# Patient Record
Sex: Female | Born: 1940 | Race: White | Hispanic: No | Smoking: Never smoker
Health system: Southern US, Community
[De-identification: ages and names within clinical notes are randomized; demographics above are authoritative.]

## PROBLEM LIST (undated history)

## (undated) DIAGNOSIS — K589 Irritable bowel syndrome without diarrhea: Secondary | ICD-10-CM

## (undated) DIAGNOSIS — Z8709 Personal history of other diseases of the respiratory system: Secondary | ICD-10-CM

## (undated) DIAGNOSIS — M1990 Unspecified osteoarthritis, unspecified site: Secondary | ICD-10-CM

## (undated) DIAGNOSIS — J189 Pneumonia, unspecified organism: Secondary | ICD-10-CM

## (undated) DIAGNOSIS — K449 Diaphragmatic hernia without obstruction or gangrene: Secondary | ICD-10-CM

## (undated) DIAGNOSIS — K219 Gastro-esophageal reflux disease without esophagitis: Secondary | ICD-10-CM

## (undated) DIAGNOSIS — Z8669 Personal history of other diseases of the nervous system and sense organs: Secondary | ICD-10-CM

## (undated) DIAGNOSIS — R112 Nausea with vomiting, unspecified: Secondary | ICD-10-CM

## (undated) DIAGNOSIS — K648 Other hemorrhoids: Secondary | ICD-10-CM

## (undated) DIAGNOSIS — T8859XA Other complications of anesthesia, initial encounter: Secondary | ICD-10-CM

## (undated) DIAGNOSIS — H409 Unspecified glaucoma: Secondary | ICD-10-CM

## (undated) DIAGNOSIS — Z9889 Other specified postprocedural states: Secondary | ICD-10-CM

## (undated) DIAGNOSIS — M549 Dorsalgia, unspecified: Secondary | ICD-10-CM

## (undated) DIAGNOSIS — G8929 Other chronic pain: Secondary | ICD-10-CM

## (undated) DIAGNOSIS — T4145XA Adverse effect of unspecified anesthetic, initial encounter: Secondary | ICD-10-CM

## (undated) HISTORY — PX: LEFT OOPHORECTOMY: SHX1961

## (undated) HISTORY — DX: Unspecified glaucoma: H40.9

## (undated) HISTORY — DX: Gastro-esophageal reflux disease without esophagitis: K21.9

## (undated) HISTORY — DX: Irritable bowel syndrome, unspecified: K58.9

## (undated) HISTORY — DX: Other hemorrhoids: K64.8

## (undated) HISTORY — PX: OTHER SURGICAL HISTORY: SHX169

## (undated) HISTORY — DX: Diaphragmatic hernia without obstruction or gangrene: K44.9

## (undated) HISTORY — PX: RIGHT OOPHORECTOMY: SHX2359

## (undated) HISTORY — PX: APPENDECTOMY: SHX54

## (undated) HISTORY — PX: TONSILLECTOMY: SUR1361

---

## 1979-07-13 HISTORY — PX: ABDOMINAL HYSTERECTOMY: SHX81

## 1984-07-12 DIAGNOSIS — J189 Pneumonia, unspecified organism: Secondary | ICD-10-CM

## 1984-07-12 HISTORY — DX: Pneumonia, unspecified organism: J18.9

## 1994-07-12 DIAGNOSIS — Z8709 Personal history of other diseases of the respiratory system: Secondary | ICD-10-CM

## 1994-07-12 HISTORY — DX: Personal history of other diseases of the respiratory system: Z87.09

## 1999-02-17 ENCOUNTER — Other Ambulatory Visit: Admission: RE | Admit: 1999-02-17 | Discharge: 1999-02-17 | Payer: Self-pay | Admitting: Obstetrics and Gynecology

## 2000-02-29 ENCOUNTER — Other Ambulatory Visit: Admission: RE | Admit: 2000-02-29 | Discharge: 2000-02-29 | Payer: Self-pay | Admitting: Obstetrics and Gynecology

## 2001-03-27 ENCOUNTER — Other Ambulatory Visit: Admission: RE | Admit: 2001-03-27 | Discharge: 2001-03-27 | Payer: Self-pay | Admitting: Obstetrics and Gynecology

## 2004-02-12 ENCOUNTER — Ambulatory Visit (HOSPITAL_COMMUNITY): Admission: RE | Admit: 2004-02-12 | Discharge: 2004-02-12 | Payer: Self-pay | Admitting: Internal Medicine

## 2004-03-06 ENCOUNTER — Ambulatory Visit (HOSPITAL_COMMUNITY): Admission: RE | Admit: 2004-03-06 | Discharge: 2004-03-06 | Payer: Self-pay | Admitting: Internal Medicine

## 2004-03-06 HISTORY — PX: COLONOSCOPY: SHX174

## 2004-03-17 ENCOUNTER — Ambulatory Visit (HOSPITAL_COMMUNITY): Admission: RE | Admit: 2004-03-17 | Discharge: 2004-03-17 | Payer: Self-pay | Admitting: Internal Medicine

## 2004-08-04 ENCOUNTER — Ambulatory Visit (HOSPITAL_COMMUNITY): Admission: RE | Admit: 2004-08-04 | Discharge: 2004-08-04 | Payer: Self-pay | Admitting: Internal Medicine

## 2004-12-17 ENCOUNTER — Ambulatory Visit: Payer: Self-pay | Admitting: Internal Medicine

## 2004-12-21 ENCOUNTER — Ambulatory Visit (HOSPITAL_COMMUNITY): Admission: RE | Admit: 2004-12-21 | Discharge: 2004-12-21 | Payer: Self-pay | Admitting: Internal Medicine

## 2005-01-08 ENCOUNTER — Ambulatory Visit: Payer: Self-pay | Admitting: Internal Medicine

## 2005-01-08 ENCOUNTER — Ambulatory Visit (HOSPITAL_COMMUNITY): Admission: RE | Admit: 2005-01-08 | Discharge: 2005-01-08 | Payer: Self-pay | Admitting: Internal Medicine

## 2005-01-08 HISTORY — PX: ESOPHAGOGASTRODUODENOSCOPY: SHX1529

## 2005-02-09 ENCOUNTER — Ambulatory Visit: Payer: Self-pay | Admitting: Internal Medicine

## 2005-08-28 IMAGING — CT CT ABDOMEN WO/W CM
3 of 6 series · 13 of 32 positions shown, 18 images · IV contrast (CONTRAST)
Comparison: none

CLINICAL DATA: Small low density area seen posterior inferior aspect right lobe of the liver on scan of 02/12/04.
 CT ABDOMEN WITHOUT/WITH CONTRAST:
 Prior to the intravenous injection of contrast material, a series of scans through the liver were made and show no definite abnormality in the region of the previously described 6 mm low density area of the right posterior lobe of the inferior liver. No other lesions are seen within the liver.  There is considerable food material and fluid within the stomach.  There is no abnormal calcification within the gallbladder or kidneys.
 The patient was then given 150 ml of Omnipaque 300 intravenously.  A series of scans were made using the protocol for hemangioma and there was seen a 6 mm hemangioma associated with the posterior aspect of the inferior region right lobe of the liver. No other lesions are seen within the liver. Both kidneys excrete contrast symmetrically.  The adrenal glands are normal. The gallbladder, hepatic radicals, common bile duct, pancreas and spleen appear normal. Lower lung feels normal.

[Series 7570: — · axial · 0.60mm/px · z∈[+1437,+1572]mm · 4 of 45 slices shown (1 of 3)]
[im 9/45  soft-tissue]
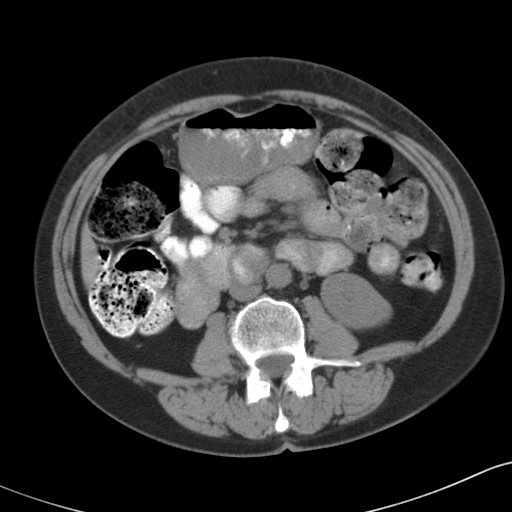
[im 18/45  soft-tissue]
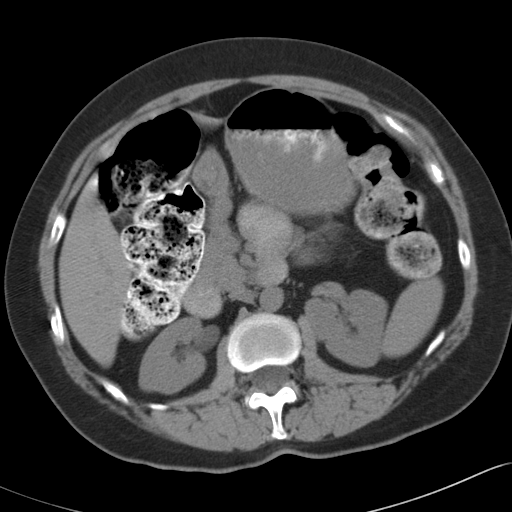
[im 27/45  soft-tissue]
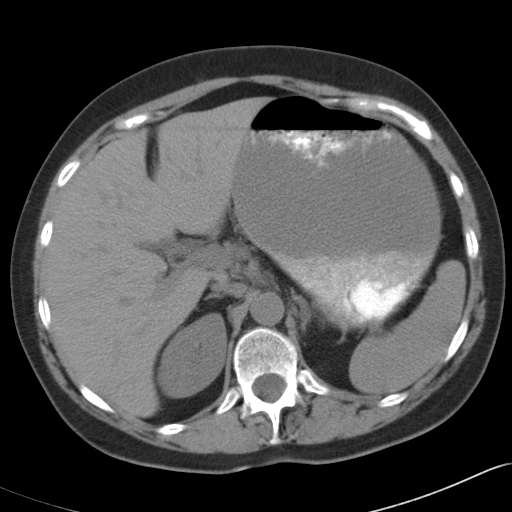
[im 36/45  soft-tissue]
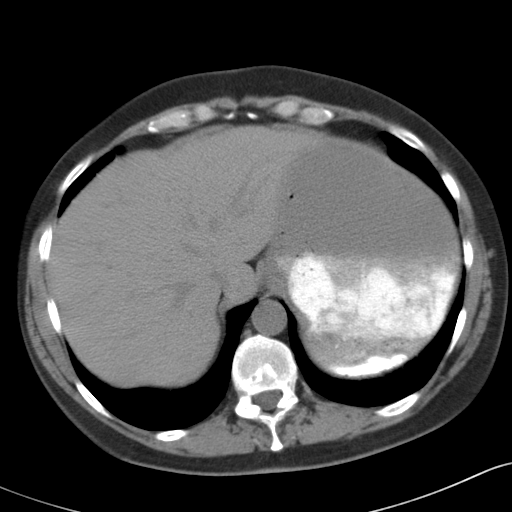

[Series 7576: — · axial · 0.61mm/px · z∈[+1414,+1594]mm · 5 of 56 slices shown, 10 images (2 of 3)]
[im 10/56  soft-tissue]
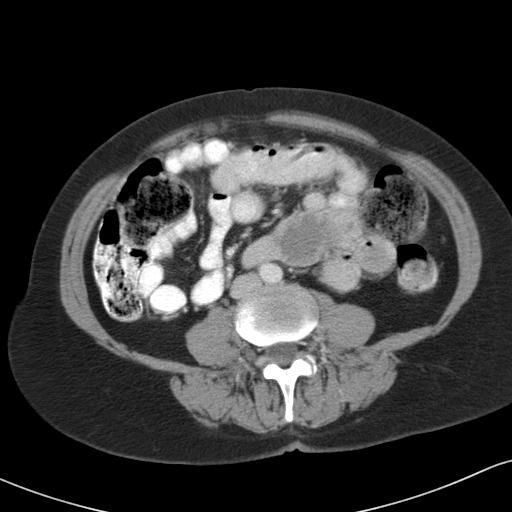
[im 10/56  bone]
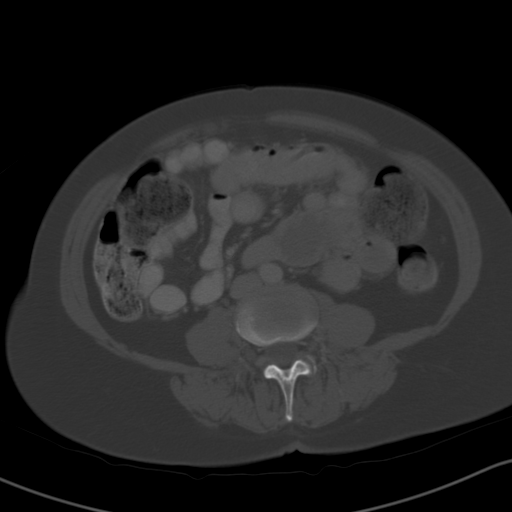
[im 19/56  soft-tissue]
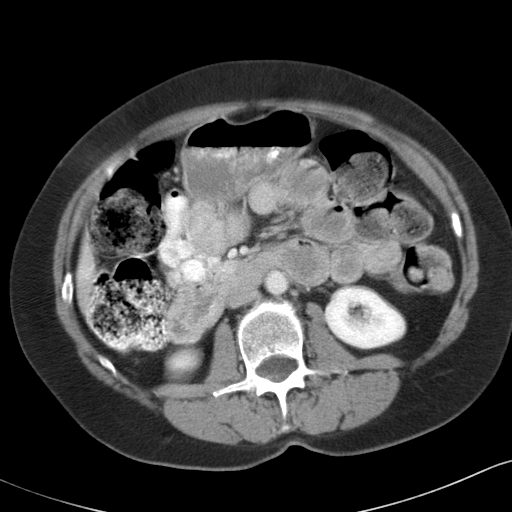
[im 19/56  lung]
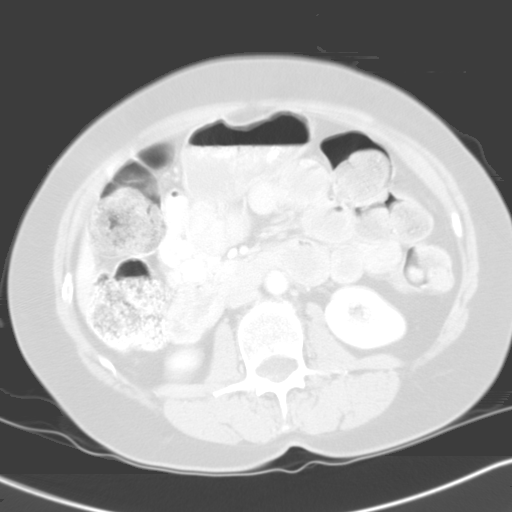
[im 28/56  soft-tissue]
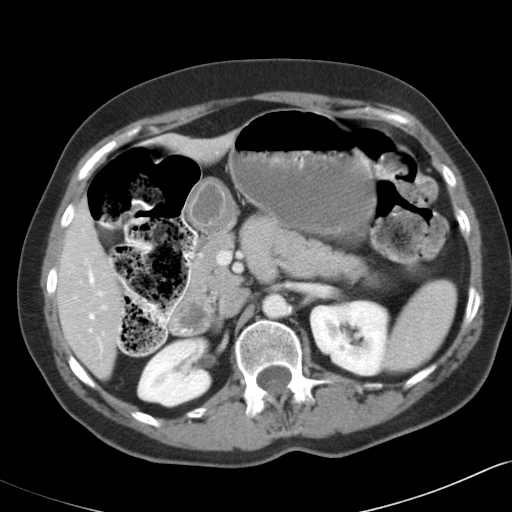
[im 28/56  lung]
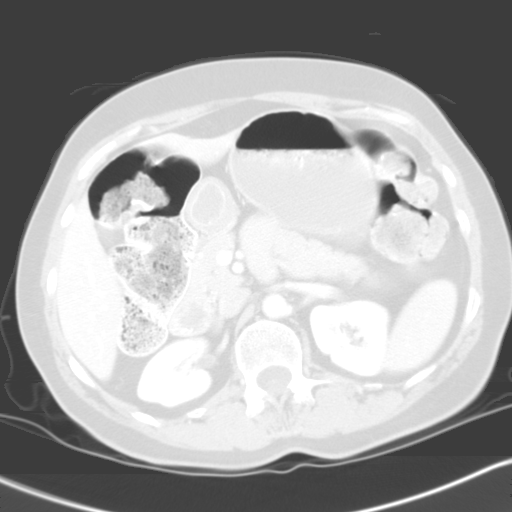
[im 37/56  soft-tissue]
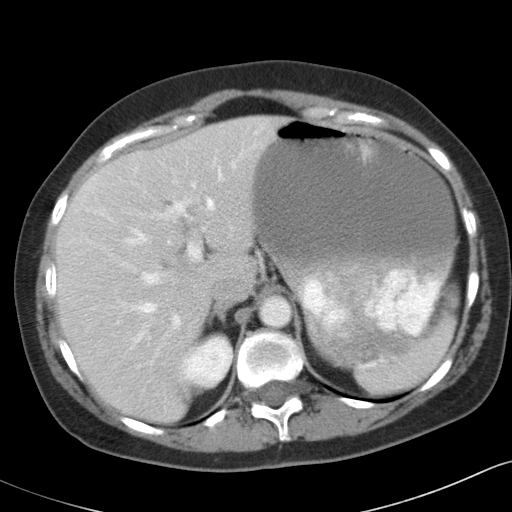
[im 37/56  lung]
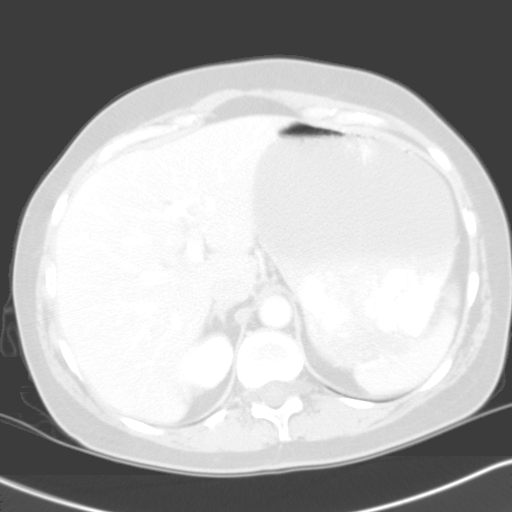
[im 46/56  soft-tissue]
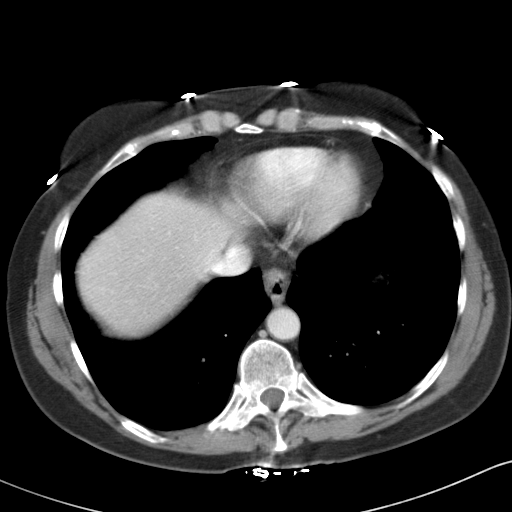
[im 46/56  lung]
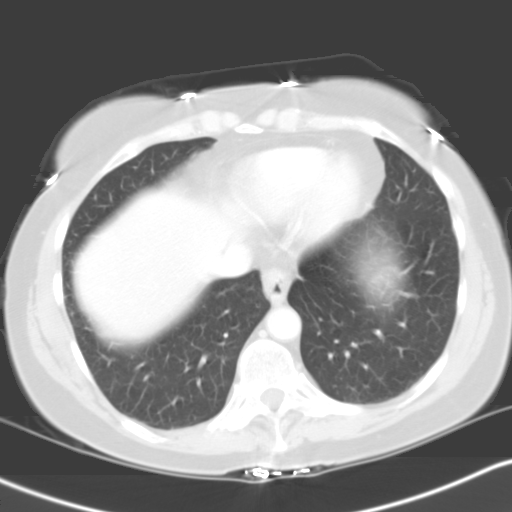

[Series 7578: — · axial · 0.60mm/px · z∈[+1430,+1570]mm · 4 of 48 slices shown (3 of 3)]
[im 10/48  soft-tissue]
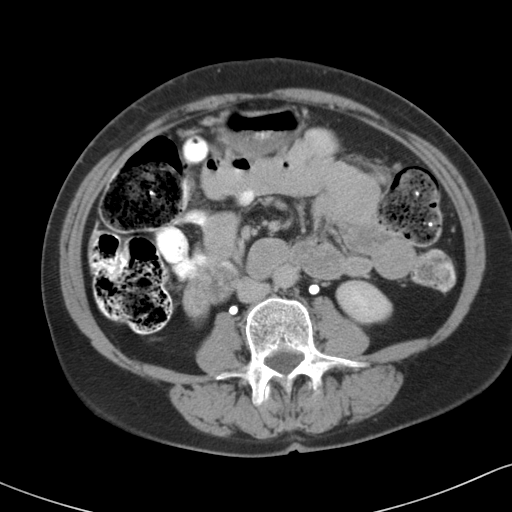
[im 19/48  soft-tissue]
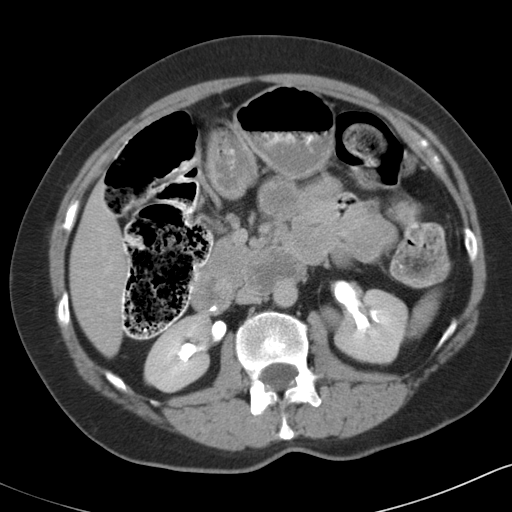
[im 29/48  soft-tissue]
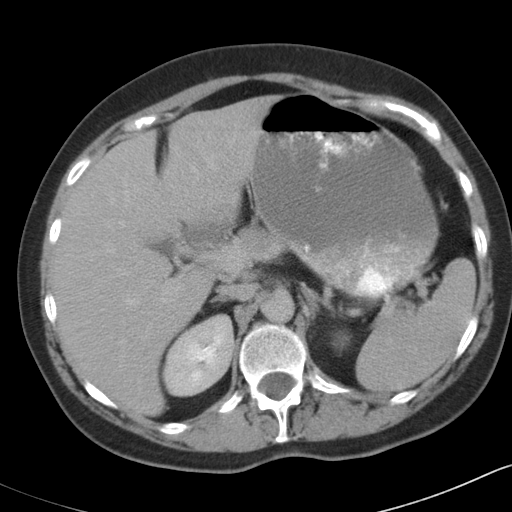
[im 38/48  soft-tissue]
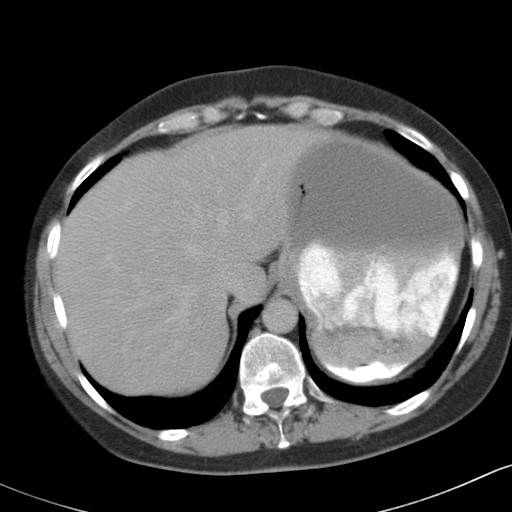

[13 of 32 positions shown; findings below may reference images not displayed]

IMPRESSION: 6 mm hemangioma posterior inferior aspect right lobe of the liver, unchanged from previous study.

## 2005-11-01 ENCOUNTER — Ambulatory Visit: Payer: Self-pay | Admitting: Internal Medicine

## 2006-12-09 ENCOUNTER — Ambulatory Visit: Payer: Self-pay | Admitting: Internal Medicine

## 2007-11-21 ENCOUNTER — Ambulatory Visit: Payer: Self-pay | Admitting: Internal Medicine

## 2009-11-20 ENCOUNTER — Encounter (INDEPENDENT_AMBULATORY_CARE_PROVIDER_SITE_OTHER): Payer: Self-pay | Admitting: *Deleted

## 2009-12-05 ENCOUNTER — Ambulatory Visit: Payer: Self-pay | Admitting: Gastroenterology

## 2009-12-05 DIAGNOSIS — K219 Gastro-esophageal reflux disease without esophagitis: Secondary | ICD-10-CM | POA: Insufficient documentation

## 2009-12-05 DIAGNOSIS — K589 Irritable bowel syndrome without diarrhea: Secondary | ICD-10-CM

## 2010-08-11 NOTE — Assessment & Plan Note (Signed)
Summary: FU OV 2 YR WITH RMR,CHRONIC GERD,IBS/CM   Visit Type:  Follow-up Visit Primary Care Provider:  Vyas  Chief Complaint:  F/U Chronic gerd/IBS.  History of Present Illness: Mikayla [name] is here for f/u GERD, IBS. She is doing well on Zegeric OTC once daily. Denies heartburn unless she stops medication for more than 2 days. Denies dysphagia, abd pain, constipation, diarrhea, melena, brbpr. She has occasional abd cramps when she gets nervous. She takes Bentyl as needed, but rarely uses it. She states her opthalmologist approved its use, she has open-angled glaucoma.  Current Medications (verified): 1)  Zegerid Otc 20-1100 Mg Caps (Omeprazole-Sodium Bicarbonate) .... One By Mouth Daily 2)  Multi-Vitamins 3)  Asa 325 .... As Needed For Back Pain 4)  Mg/calcium/zinc/vit D .... One By Mouth Bid 5)  Caltrate 600+d 600-400 Mg-Unit Tabs (Calcium Carbonate-Vitamin D) .... One Bid 6)  Bayer Womens 81-300 Mg Tabs (Aspirin-Calcium Carbonate) .... One By Mouth Daily 7)  Bentyl 10 Mg Caps (Dicyclomine Hcl) .... One By Mouth Daily Prn  Allergies (verified): 1)  ! Penicillin 2)  ! * All Mycins 3)  ! Niacin  Past History:  Past Medical History: GERD Glaucoma Irritable Bowel Syndrome EGD, 6/06-->multiple distal esophageal erosions, small hiatal hernia TCS, 8/05-->internal hemorrhoids  Past Surgical History: Right oophorectomy  Partial left oophorectomy Appendectomy  Family History: Mother, RA Father, bone cancer Paternal uncle, pancreatic cancer No FH CRC  Social History: Widowed. No children. Retired from [employer]. Never smoked. No alcohol.  Review of Systems      See HPI  Vital Signs:  Patient profile:   70 year old female Height:      64 inches Weight:      177 pounds BMI:     30.49 Temp:     98.4 degrees F oral Pulse rate:   84 / minute BP sitting:   120 / 78  (left arm) Cuff size:   regular  Vitals Entered By: Mikayla Spring LPN (Dec 05, 2009 10:25 AM)  Physical  Exam  General:  Well developed, well nourished, no acute distress. Head:  Normocephalic and atraumatic. Eyes:  Sclera nonicteric. Mouth:  OP moist. Abdomen:  Bowel sounds normal.  Abdomen is soft, nontender, nondistended.  No rebound or guarding.  No hepatosplenomegaly, masses or hernias.  No abdominal bruits.  Extremities:  No clubbing, cyanosis, edema or deformities noted. Neurologic:  Alert and  oriented x4;  grossly normal neurologically. Skin:  Intact without significant lesions or rashes. Psych:  Alert and cooperative. Normal mood and affect.  Impression & Recommendations:  Problem # 1:  GERD (ICD-530.81)  Well-controlled on Zegerid OTC daily. OV in 2 years with Dr. Jena Gauss.  Orders: Est. Patient Level II (60454)  Problem # 2:  IRRITABLE BOWEL SYNDROME (ICD-564.1)  Doing well. Uses bentyl rarely.  Orders: Est. Patient Level II (09811)  Appended Document: FU OV 2 YR WITH RMR,CHRONIC GERD,IBS/CM reminder in computer

## 2010-08-11 NOTE — Letter (Signed)
Summary: Recall Office Visit  [facility]  [address]   [place], [place] [address]   Phone: [phone]  Fax: [phone]      Nov 20, 2009   [place] [address] [place], [place]  [address] 01/23/1941   Dear Ms. Stepter,   According to our records, it is time for you to schedule a follow-up office visit with [place].   At your convenience, please call [phone] to schedule an office visit. If you have any questions, concerns, or feel that this letter is in error, we would appreciate your call.   Sincerely,    Diana Eves  [facility] Ph: [phone]   Fax: [phone]

## 2010-11-24 NOTE — Assessment & Plan Note (Signed)
NAME:  Mikayla, [name]                  CHART#:  [id number]   DATE:  11/21/2007                       DOB:  1941/02/15   CHIEF COMPLAINT:  Followup GERD and IBS.   PROBLEM LIST:  1. 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  2. History of glaucoma.  3. Erosive reflux esophagitis/chronic GERD, on daily PPI.  4. Negative colonoscopy August 2005,  Dr. Jena Gauss.  5. EGD June 2006, reflux esophagitis.   SUBJECTIVE:  Mikayla [name] is a 70 year old Caucasian female.  She has been  doing quite well since she was last seen about 1 year ago.  She has not  really had to use Levbid or Bentyl as far as for her diarrhea  predominant IBS.  She is having a bowel movement about every day or so.  She does have history of glaucoma and has recently been seeing Dr.  Harlon Flor through [facility].  He also has an office in [place].  She  does take Metamucil every day.  She is using Zegerid 40 mg daily for her  chronic GERD, which is well controlled.  She denies any breakthrough  symptoms.  She did have a dual DEXA bone density test within the last 2  years which was borderline normal.  She has increased her dietary  calcium and is doing some weightbearing exercises.  She is supposed to  see Dr. Sherril Croon complete annual physical exam this month.  Her weight is  steadily increasing.  She has gained 20 pounds in the last 2 years.   CURRENT MEDICATIONS:  See this from Nov 21, 2007.   ALLERGIES:  PENICILLIN AND NIACIN.   OBJECTIVE:  VITAL SIGNS:  Weight 172-1/2 pounds, height 64 inches,  temperature 97.9, blood pressure 110/80 and pulse 80.  GENERAL:  Mikayla [name] is a well-developed, well-nourished Caucasian female  in no acute distress.  HEENT.  Sclerae clear, nonicteric.  Conjunctivae pink.  Oropharynx pink  and moist without lesions.  CHEST:  Heart regular rate and rhythm.  Normal S1, S2.  No murmurs,  clicks, rubs or gallops.  ABDOMEN:  Positive bowel sounds x4.  No bruits auscultated.  Soft,  nontender,  nondistended without palpable mass or hepatosplenomegaly.  No  rebound or guarding.  EXTREMITIES:  Without clubbing or edema.  SKIN:  Pink, warm and dry without any rash or jaundice.   ASSESSMENT:  1. Diarrhea predominant irritable bowel syndrome (IBS), well      controlled at this time with daily fiber.  2. Chronic gastroesophageal reflux disease (GERD)/history of erosive      esophagitis, well controlled on proton pump inhibitor (PPI).   PLAN:  1. Avoid Levbid and Bentyl given history of glaucoma.  If she does      need to resume antispasmodic, we will need to speak with her      ophthalmologist, Dr. Harlon Flor, at [facility] prior to resuming.  2. Continue Zegerid 40 mg daily.  3. Continue Metamucil daily.  4. Office visit in 2 years or sooner if needed with Dr. Jena Gauss.  5. Please follow up with Dr. Sherril Croon regarding annual physical exam and      laboratory studies.       Lorenza Burton, N.P.  Electronically Signed     R. Roetta Sessions, M.D.  Electronically Signed    KJ/MEDQ  D:  11/21/2007  T:  11/21/2007  Job:  [id number]   cc:   R. Roetta Sessions, M.D.  Doreen Beam, MD

## 2010-11-24 NOTE — Assessment & Plan Note (Signed)
Mikayla [name], Mikayla [name]                  CHART#:  [id number]   DATE:  12/09/2006                       DOB:  1940/09/06   Followup IBS, erosive reflux esophagitis/GERD.  Last seen November 01, 2005.  This nice lady has done extremely well on Zegerid 40 mg orally  daily.  Tendency towards diarrhea-predominant IBS symptoms, has been on  Levbid 1 tablet each morning until recently.  She ran out of her  prescription and that preparation is no longer available.  She also  takes Metamucil daily.  She has 1-2 bowel movements daily.  Has not had  any blood per rectum.  Never has had more than 3 bowel movements in a  day, and does not go a day without a bowel movement.  She has gained 7  pounds since her last visit.  She had a negative colonoscopy in August  of 2005.  Her EGD in June of 2006 demonstrated erosive reflux  esophagitis, no Barrett's.   There is no family history of colorectal dysplasia.   CURRENT MEDICATIONS:  See updated list.   ALLERGIES:  PENICILLIN, NIACIN, ALL E-MYCINS, ALL MYCINS.   PHYSICAL EXAMINATION:  She looks well.  Weight 167, height 5 feet 4-1/2 inches, temp 98.1, BP 102/60, pulse 80.  SKIN:  Warm and dry.  CHEST:  Lungs are clear to auscultation.  HEART:  Regular rate and rhythm without murmurs, gallops, or rubs.  ABDOMEN:  Nondistended.  Positive bowel sounds.  Soft, entirely  nontender without appreciable mass or organomegaly.   ASSESSMENT:  1. Diarrhea-predominant irritable bowel syndrome.  Symptoms are now      quiescent.  She takes a daily fiber supplement in the way of      Metamucil.  I have advised her to continue that agent.  2. History of erosive reflux esophagitis/gastroesophageal reflux      disease, continue Zegerid 40 mg orally daily.  I told her she could      try to drop back and take a capsule once every other day, but if      that was unsatisfactory, not to hesitate to take it daily.  3. She may or may not have a flare of her irritable bowel  syndrome      symptoms in the future.  If she does, would consider the shorter      acting Levsin sublingual preparation 0.125 mg      hyoscyamine a.c. and h.s., but I told her if she has problems she      can call in and let us know, otherwise plan to see this nice lady      back in 1 year.       Jonathon Bellows, M.D.  Electronically Signed     RMR/MEDQ  D:  12/09/2006  T:  12/09/2006  Job:  [id number]   cc:   Doreen Beam

## 2010-11-27 NOTE — Consult Note (Signed)
NAME:  Mikayla [name], Trick NO.:  [id number]   MEDICAL RECORD NO.:  [id number]                  PATIENT TYPE:   LOCATION:                                       FACILITY:   PHYSICIAN:  R. Roetta Sessions, M.D.              DATE OF BIRTH:  1941-03-07   DATE OF CONSULTATION:  DATE OF DISCHARGE:                                   CONSULTATION   REFERRING PHYSICIAN:  Dr. Doreen Beam, [facility].   CONSULTING PHYSICIAN:  R. Roetta Sessions, M.D.   REASON FOR CONSULTATION:  Lower abdominal pain.   HISTORY OF PRESENT ILLNESS:  Mikayla [name] is a 70 year old Caucasian female who  presents today with complaints of lower abdominal pain which has been going  on since March 2005.  She has intermittent attacks of diffuse lower  abdominal pain which occurs below her umbilicus and throughout the lower  abdomen.  The pain sometimes will build up into very severe pain at which  time she may have a syncopal episode lasting anywhere from two minutes.  She  states this has been witnessed previously by some of her family members or  friends.  At other times she develops the pain and it is constant but does  not increase in severity.  She denies any associated symptoms such as  constipation, diarrhea.  Occasionally, she has vomited with these episodes.  Bowels generally move once daily.  Denies any melena or rectal bleeding.  She has had an intentional weight loss of approximately 8 pounds.  She has  occasional heartburn symptoms.  Denies any dysuria, urinary frequency,  hematuria, vaginal discharge.   CURRENT MEDICATIONS:  1. __________ 0.5% bilateral q.a.m.  2. Lumigan bilateral q.h.s. every day.  3. Caltrate b.i.d.   ALLERGIES:  1. PENICILLIN and,  2. All MYCINS cause swelling.  3. NIACIN caused tongue swelling.   PAST MEDICAL HISTORY:  Glaucoma.   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   FAMILY HISTORY:  Mother had rheumatoid arthritis.  Father had bone cancer.  Paternal uncle had pancreatic cancer.  No known colon cancer in the family.   SOCIAL HISTORY:  She is widowed.  She has no children.  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  She has never been a smoker.  Denies any  alcohol use.   REVIEW OF SYSTEMS:  Please see HPI for GI, GU.  CARDIOPULMONARY:  Denies any  chest pain or shortness of breath.   PHYSICAL EXAMINATION:  VITAL SIGNS:  Weight 151, height 5'4 and 1/2, blood  pressure 100/70, pulse 76.  GENERAL:  A pleasant, well nourished, well developed, Caucasian female in no  acute distress.  SKIN:  Warm and dry.  No jaundice.  HEENT:  Pupils equal, round and reactive to light.  Conjunctivae are  pink.  Sclerae are nonicteric.  Oropharyngeal mucosa moist and pink.  No lesions,  erythema, or exudate.  No lymphadenopathy, thyromegaly.  CHEST:  Lungs clear to auscultation.  CARDIAC:  Reveals a regular rate and rhythm.  Normal S1 and S2.  No murmurs,  rubs, or gallops.  ABDOMEN:  Positive bowel sounds.  Soft, nontender, nondistended.  No  organomegaly or masses.  EXTREMITIES:  No edema.   IMPRESSION:  Mikayla [name] is a 70 year old lady with a several month history of  intermittent severe lower abdominal pain with some associated episodes of  syncope.  She has had occasional vomiting with these episodes but really no  other gastrointestinal symptoms.  The etiology is unclear at this time.  She  may have intermittent intestinal obstruction secondary to adhesive disease,  acute intermittent porphyria is a possibility as well.  The pain really does  not sound biliary in etiology.  Given no change in bowel habits, it would be  unlikely if she had inflammatory bowel disease.  After examination today, we  will proceed with computed tomography of the abdomen and pelvis for further  evaluation of her symptoms.  In addition, she is due a  colonoscopy for  screening purposes as well.   PLAN:  1. CT of the abdomen and pelvis with IV and oral contrast.  2. Colonoscopy.   Further recommendations to follow.     ________________________________________  ___________________________________________  Tana Coast, P.AJonathon Bellows, M.D.   LL/MEDQ  D:  02/11/2004  T:  02/11/2004  Job:  [id number]   cc:   R. Roetta Sessions, M.D.  [address]  [place]  [place] [address]  Fax: [phone]   Doreen Beam  [address]  [place]  [place] [address]  Fax: [phone]

## 2010-11-27 NOTE — Op Note (Signed)
NAME:  [name], Mikayla                 ACCOUNT NO.:  [id number]   MEDICAL RECORD NO.:  [id number]          PATIENT TYPE:  AMB   LOCATION:  DAY                           FACILITY:  [facility]   PHYSICIAN:  R. Roetta Sessions, M.D. DATE OF BIRTH:  Oct 26, 1940   DATE OF PROCEDURE:  01/08/2005  DATE OF DISCHARGE:                                 OPERATIVE REPORT   PROCEDURE:  Diagnostic esophagogastroduodenoscopy.   INDICATIONS FOR PROCEDURE:  The patient is a 70 year old lady with  intermittent abdominal pain predominantly lower from time to time.  We  started her on some hyoscyamine, and found she had open angled glaucoma,  which has been associated with significant improvement in her symptoms.  Upper GI and small bowel follow-through demonstrated hiatal hernia and  gastroesophageal reflux, and inflammatory changes of the antrum.  EGD is now  being done.  This procedure was discussed with the patient previously and  again at the bedside.  The potential risks, benefits, and alternatives had  been reviewed.  Questions were answered, consent given.  Please see  documentation in medical record.   PROCEDURE NOTE:  Oxygen saturation, blood pressure, and respirations were  monitored throughout the entirety of the procedure.  Sedation with Versed 4  mg intravenously and Demerol 75 mg intravenously in divided doses.  The  instrument was the Olympus video system.   FINDINGS:  Examination of the tubular esophagus revealed multiple  circumferential linear distal esophageal erosions approximately 3 cm in  length following the mid distal one-third of the esophagus.  There was no  Barrett's esophagus.  The EG junction was easily traversed.  In the stomach,  the gastric cavity was empty.  Insufflated well there.  Examination of the  gastric mucosa revealed retroflexed view of the proximal stomach and  esophagogastric junction demonstrating only a small hiatal hernia.  The  pylorus was patent and easily traversed.   Examination of the second portion  revealed no abnormalities.   THERAPY/DIAGNOSTIC MANEUVERS PERFORMED:  None.   DISPOSITION:  The patient tolerated the procedure well and was reactive.   IMPRESSION:  Multiple distal esophageal erosions consistent with significant  erosive reflux esophagitis, small hiatal hernia, otherwise normal stomach,  D1 and D2.  No evidence of Barrett's esophagus.   RECOMMENDATIONS:  1.  Anti-reflux literature provided to [name].  2.  Begin Zegerid 40 mg orally daily each morning.  3.  Follow-up appointment with [place] in one month.  4.  She is to continue her hyoscyamine on a p.r.n. basis.       RMR/MEDQ  D:  01/08/2005  T:  01/08/2005  Job:  [id number]   cc:   Dr. Berline Chough, Internal Medicine  [place], [place]

## 2010-11-27 NOTE — Op Note (Signed)
NAME:  Mikayla [name], Mikayla [name]                           ACCOUNT NO.:  [id number]   MEDICAL RECORD NO.:  [id number]                   PATIENT TYPE:  AMB   LOCATION:  DAY                                  FACILITY:  [facility]   PHYSICIAN:  R. Roetta Sessions, M.D.              DATE OF BIRTH:  12/25/1940   DATE OF PROCEDURE:  03/06/2004  DATE OF DISCHARGE:                                 OPERATIVE REPORT   PROCEDURE:  Screening colonoscopy.   INDICATION FOR PROCEDURE:  The patient is a 70 year old lady who comes for  colorectal cancer screening.  She has never had a colonoscopy.  She has had  some nonspecific lower abdominal pain, for which she was seen in our office  previously.  CT of the abdomen and pelvis demonstrated a small hepatic  hemangioma, otherwise no significant findings, certainly nothing to explain  her abdominal pain, which she tells me has completely RESOLVED since.  She  has not had any pain since February 04, 2004.  Colonoscopy is now being done as  a screening maneuver.  She has no family history of colorectal neoplasia.   PROCEDURE NOTE:  O2 saturation, blood pressure, pulse, and respirations were  monitored throughout the entire procedure.   CONSCIOUS SEDATION:  Versed 4 mg IV, Demerol 50 mg IV in divided doses.   INSTRUMENT USED:  Olympus video chip system.   FINDINGS:  Digital rectal exam revealed no abnormalities.   ENDOSCOPIC FINDINGS:  The prep was adequate.   Rectum:  Examination of the rectal mucosa, including retroflexed view of the  anal verge, revealed only internal hemorrhoids.   Colon:  The colonic mucosa was surveyed from the rectosigmoid junction  through the left, transverse, right colon, to the area of the appendiceal  orifice, ileocecal valve, and cecum.  These structures were well-seen and  photographed for the record.  From this level the scope was slowly  withdrawn.  All previously-mentioned mucosal surfaces were again seen.  The  colonic mucosa appeared  normal.  The patient tolerated the procedure well  and was reacted in endoscopy.   IMPRESSION:  1. Internal hemorrhoids, otherwise normal rectum.  2. Normal colon.   RECOMMENDATIONS:  1. Repeat colonoscopy 10 years.  2. A follow-up appointment with [place] in three months to make sure she is doing     well prior to concluding the consultation.      ___________________________________________                                            Jonathon Bellows, M.D.   RMR/MEDQ  D:  03/06/2004  T:  03/06/2004  Job:  [id number]   cc:   Dr. Sherril Croon, Internal Medicine, [place], [place]

## 2011-07-19 DIAGNOSIS — H409 Unspecified glaucoma: Secondary | ICD-10-CM | POA: Diagnosis not present

## 2011-07-19 DIAGNOSIS — H4011X0 Primary open-angle glaucoma, stage unspecified: Secondary | ICD-10-CM | POA: Diagnosis not present

## 2011-11-09 ENCOUNTER — Encounter: Payer: Self-pay | Admitting: Internal Medicine

## 2011-11-12 DIAGNOSIS — H2510 Age-related nuclear cataract, unspecified eye: Secondary | ICD-10-CM | POA: Diagnosis not present

## 2011-11-12 DIAGNOSIS — H4011X0 Primary open-angle glaucoma, stage unspecified: Secondary | ICD-10-CM | POA: Diagnosis not present

## 2011-11-12 DIAGNOSIS — H409 Unspecified glaucoma: Secondary | ICD-10-CM | POA: Diagnosis not present

## 2011-12-17 ENCOUNTER — Ambulatory Visit: Payer: Self-pay | Admitting: Internal Medicine

## 2011-12-28 ENCOUNTER — Ambulatory Visit: Payer: Self-pay | Admitting: Internal Medicine

## 2012-01-18 ENCOUNTER — Ambulatory Visit (INDEPENDENT_AMBULATORY_CARE_PROVIDER_SITE_OTHER): Payer: Medicare Other | Admitting: Internal Medicine

## 2012-01-18 ENCOUNTER — Encounter: Payer: Self-pay | Admitting: Internal Medicine

## 2012-01-18 VITALS — BP 101/66 | HR 69 | Temp 97.8°F | Ht 64.0 in | Wt 182.2 lb

## 2012-01-18 DIAGNOSIS — K219 Gastro-esophageal reflux disease without esophagitis: Secondary | ICD-10-CM

## 2012-01-18 NOTE — Progress Notes (Signed)
Primary Care Physician:  Ignatius Specking., MD Primary Gastroenterologist:  Dr. Jena Gauss  Pre-Procedure History & Physical: HPI:  Mikayla [name] is a 71 y.o. female here for followup of GERD/erosive reflux esophagitis -  it's been 2 years since she's been seen. Takes OTC Zegerid 40 mg daily. If she misses up to 3 days in a row, she starts having reflux symptoms once again. No melena no dysphagia. She did not have Barrett's esophagus on prior EGD. She'll be due for routine average risk screening colonoscopy in 2 years. No bowel symptoms. No history of hospitalizations or new intercurrent illnesses since she was last seen.  Past Medical History  Diagnosis Date  . GERD (gastroesophageal reflux disease)   . Glaucoma   . IBS (irritable bowel syndrome)   . Hiatal hernia     small  . Internal hemorrhoids   . Hepatic hemangioma     Past Surgical History  Procedure Date  . Right oophorectomy   . Left oophorectomy     partial  . Appendectomy   . Esophagogastroduodenoscopy 01/08/2005    Dr. Jena Gauss- multiple distal esophageal erosions consistent with erosive reflux esophagitis, small hiatal hernia o/w normal stomach  . Colonoscopy 03/06/2004    Dr. Jena Gauss- internal hemorrhoids, o/w normal colon.    Prior to Admission medications   Medication Sig Start Date End Date Taking? Authorizing Provider  Ascorbic Acid (VITAMIN C) 1000 MG tablet Take 1,000 mg by mouth daily.   Yes Historical Provider, MD  calcium carbonate (OS-CAL) 600 MG TABS Take 600 mg by mouth 2 (two) times daily with a meal.   Yes Historical Provider, MD  LUMIGAN 0.01 % SOLN Place 1 drop into both eyes at bedtime.  12/19/11  Yes Historical Provider, MD  omeprazole-sodium bicarbonate (ZEGERID) 40-1100 MG per capsule Take 1 capsule by mouth daily before breakfast.   Yes Historical Provider, MD  Probiotic Product (ALIGN) 4 MG CAPS Take 4 mg by mouth daily.   Yes Historical Provider, MD  UNABLE TO FIND Take 81 mg by mouth daily. Woman's Bayer   Yes  Historical Provider, MD  vitamin B-12 (CYANOCOBALAMIN) 1000 MCG tablet Take 1,000 mcg by mouth daily.   Yes Historical Provider, MD  Vitamins A & D (VITAMIN A & D PO) Take by mouth daily.   Yes Historical Provider, MD  zinc sulfate 220 MG capsule Take 220 mg by mouth daily.   Yes Historical Provider, MD    Allergies as of 01/18/2012 - Review Complete 01/18/2012  Allergen Reaction Noted  . Niacin    . Penicillins Other (See Comments)     No family history on file.  History   Social History  . Marital Status: Widowed    Spouse Name: N/A    Number of Children: N/A  . Years of Education: N/A   Occupational History  . Not on file.   Social History Main Topics  . Smoking status: Never Smoker   . Smokeless tobacco: Not on file  . Alcohol Use: No  . Drug Use: No  . Sexually Active: Not on file   Other Topics Concern  . Not on file   Social History Narrative  . No narrative on file    Review of Systems: See HPI, otherwise negative ROS  Physical Exam: BP 101/66  Pulse 69  Temp 97.8 F (36.6 C) (Temporal)  Ht 5\' 4"  (1.626 m)  Wt 182 lb 3.2 oz (82.645 kg)  BMI 31.27 kg/m2 General:   Alert,  Well-developed, well-nourished,  pleasant and cooperative in NAD Skin:  Intact without significant lesions or rashes. Eyes:  Sclera clear, no icterus.   Conjunctiva pink. Ears:  Normal auditory acuity. Nose:  No deformity, discharge,  or lesions. Mouth:  No deformity or lesions. Neck:  Supple; no masses or thyromegaly. No significant cervical adenopathy. Lungs:  Clear throughout to auscultation.   No wheezes, crackles, or rhonchi. No acute distress. Heart:  Regular rate and rhythm; no murmurs, clicks, rubs,  or gallops. Abdomen: Non-distended, normal bowel sounds.  Soft and nontender without appreciable mass or hepatosplenomegaly.  Pulses:  Normal pulses noted. Extremities:  Without clubbing or edema.  Impression/Plan:   History of GERD, complicated by definition with erosive  reflux esophagitis. Doing well on Zegerid. Discussed the long-term risks of Zegerid, however, we now know she cannot go without it after a couple of days. I feel in this setting the benefits of long-term chronic therapy outweigh the risk. I suggest she may see if she can get by with an every other day regimen but would not hesitate to take daily if that's what it takes to control symptoms  We'll plan to see this nice lady back in 2 years for followup and to set up her second ever screening colonoscopy.

## 2012-01-18 NOTE — Patient Instructions (Addendum)
Continue Zegerid 40 mg capsule daily  OV here in 2 years for followup and to set up colonoscopy

## 2012-03-05 DIAGNOSIS — H53019 Deprivation amblyopia, unspecified eye: Secondary | ICD-10-CM | POA: Diagnosis not present

## 2012-03-05 DIAGNOSIS — H4011X0 Primary open-angle glaucoma, stage unspecified: Secondary | ICD-10-CM | POA: Diagnosis not present

## 2012-03-05 DIAGNOSIS — H2510 Age-related nuclear cataract, unspecified eye: Secondary | ICD-10-CM | POA: Diagnosis not present

## 2012-08-01 DIAGNOSIS — H4011X0 Primary open-angle glaucoma, stage unspecified: Secondary | ICD-10-CM | POA: Diagnosis not present

## 2012-09-14 DIAGNOSIS — H53019 Deprivation amblyopia, unspecified eye: Secondary | ICD-10-CM | POA: Diagnosis not present

## 2012-09-14 DIAGNOSIS — H4011X0 Primary open-angle glaucoma, stage unspecified: Secondary | ICD-10-CM | POA: Diagnosis not present

## 2012-09-14 DIAGNOSIS — H2510 Age-related nuclear cataract, unspecified eye: Secondary | ICD-10-CM | POA: Diagnosis not present

## 2013-03-20 DIAGNOSIS — H04129 Dry eye syndrome of unspecified lacrimal gland: Secondary | ICD-10-CM | POA: Diagnosis not present

## 2013-03-20 DIAGNOSIS — H53019 Deprivation amblyopia, unspecified eye: Secondary | ICD-10-CM | POA: Diagnosis not present

## 2013-03-20 DIAGNOSIS — H47239 Glaucomatous optic atrophy, unspecified eye: Secondary | ICD-10-CM | POA: Diagnosis not present

## 2013-03-20 DIAGNOSIS — H409 Unspecified glaucoma: Secondary | ICD-10-CM | POA: Diagnosis not present

## 2013-03-20 DIAGNOSIS — H2510 Age-related nuclear cataract, unspecified eye: Secondary | ICD-10-CM | POA: Diagnosis not present

## 2013-09-18 DIAGNOSIS — H4011X0 Primary open-angle glaucoma, stage unspecified: Secondary | ICD-10-CM | POA: Diagnosis not present

## 2013-09-18 DIAGNOSIS — H409 Unspecified glaucoma: Secondary | ICD-10-CM | POA: Diagnosis not present

## 2013-09-20 DIAGNOSIS — H4011X0 Primary open-angle glaucoma, stage unspecified: Secondary | ICD-10-CM | POA: Diagnosis not present

## 2013-12-25 DIAGNOSIS — H04129 Dry eye syndrome of unspecified lacrimal gland: Secondary | ICD-10-CM | POA: Diagnosis not present

## 2013-12-25 DIAGNOSIS — H47239 Glaucomatous optic atrophy, unspecified eye: Secondary | ICD-10-CM | POA: Diagnosis not present

## 2013-12-25 DIAGNOSIS — H4011X0 Primary open-angle glaucoma, stage unspecified: Secondary | ICD-10-CM | POA: Diagnosis not present

## 2014-01-02 ENCOUNTER — Encounter: Payer: Self-pay | Admitting: Internal Medicine

## 2014-02-07 ENCOUNTER — Encounter: Payer: Self-pay | Admitting: Gastroenterology

## 2014-02-07 ENCOUNTER — Encounter (INDEPENDENT_AMBULATORY_CARE_PROVIDER_SITE_OTHER): Payer: Self-pay

## 2014-02-07 ENCOUNTER — Ambulatory Visit (INDEPENDENT_AMBULATORY_CARE_PROVIDER_SITE_OTHER): Payer: Medicare Other | Admitting: Gastroenterology

## 2014-02-07 ENCOUNTER — Other Ambulatory Visit: Payer: Self-pay | Admitting: Internal Medicine

## 2014-02-07 VITALS — BP 117/76 | HR 71 | Temp 98.8°F | Ht 64.0 in | Wt 182.8 lb

## 2014-02-07 DIAGNOSIS — Z1211 Encounter for screening for malignant neoplasm of colon: Secondary | ICD-10-CM

## 2014-02-07 DIAGNOSIS — K219 Gastro-esophageal reflux disease without esophagitis: Secondary | ICD-10-CM | POA: Diagnosis not present

## 2014-02-07 MED ORDER — PEG 3350-KCL-NA BICARB-NACL 420 G PO SOLR: 4000.0000 mL | ORAL | Status: DC

## 2014-02-07 NOTE — Progress Notes (Signed)
Primary Care Physician:  Ignatius Specking., MD  Primary Gastroenterologist:  Roetta Sessions, MD   Chief Complaint  Patient presents with  . Follow-up    doing ok    HPI:  Mikayla [name] is a 73 y.o. female here for two-year followup of chronic GERD. She does very well on over-the-counter Zegerid. Denies any breakthrough heartburn, dysphagia, vomiting, weight loss, anorexia, abdominal pain. Bowel movements are regular. No blood in the stool or melena. She is due for average risk colon cancer screening.    Current Outpatient Prescriptions  Medication Sig Dispense Refill  . Ascorbic Acid (VITAMIN C) 1000 MG tablet Take 1,000 mg by mouth daily.      . cycloSPORINE (RESTASIS) 0.05 % ophthalmic emulsion 1 drop 2 (two) times daily.      Marland Kitchen LUMIGAN 0.01 % SOLN Place 1 drop into both eyes at bedtime.       . Multiple Vitamins-Minerals (ICAPS) CAPS Take by mouth.      Maxwell Caul Bicarbonate (ZEGERID) 20-1100 MG CAPS capsule Take 1 capsule by mouth daily before breakfast.      . Probiotic Product (ALIGN) 4 MG CAPS Take 4 mg by mouth daily.      Marland Kitchen UNABLE TO FIND Take 81 mg by mouth daily. Woman's Bayer      . vitamin B-12 (CYANOCOBALAMIN) 1000 MCG tablet Take 1,000 mcg by mouth daily.      . Vitamins A & D (VITAMIN A & D PO) Take by mouth daily.      Marland Kitchen zinc sulfate 220 MG capsule Take 220 mg by mouth daily.       No current facility-administered medications for this visit.    Allergies as of 02/07/2014 - Review Complete 02/07/2014  Allergen Reaction Noted  . Niacin    . Penicillins Other (See Comments)     Past Medical History  Diagnosis Date  . GERD (gastroesophageal reflux disease)   . Glaucoma   . IBS (irritable bowel syndrome)   . Hiatal hernia     small  . Internal hemorrhoids   . Hepatic hemangioma     Past Surgical History  Procedure Laterality Date  . Right oophorectomy    . Left oophorectomy      partial  . Appendectomy    . Esophagogastroduodenoscopy  01/08/2005     Dr. Jena Gauss- multiple distal esophageal erosions consistent with erosive reflux esophagitis, small hiatal hernia o/w normal stomach  . Colonoscopy  03/06/2004    Dr. Jena Gauss- internal hemorrhoids, o/w normal colon.    Family History  Problem Relation Age of Onset  . Colon cancer Neg Hx     History   Social History  . Marital Status: Widowed    Spouse Name: N/A    Number of Children: N/A  . Years of Education: N/A   Occupational History  . Not on file.   Social History Main Topics  . Smoking status: Never Smoker   . Smokeless tobacco: Not on file  . Alcohol Use: No  . Drug Use: No  . Sexual Activity: Not on file   Other Topics Concern  . Not on file   Social History Narrative  . No narrative on file      ROS:  General: Negative for anorexia, weight loss, fever, chills, fatigue, weakness. Eyes: Negative for vision changes.  ENT: Negative for hoarseness, difficulty swallowing , nasal congestion. CV: Negative for chest pain, angina, palpitations, dyspnea on exertion, peripheral edema.  Respiratory: Negative for dyspnea at rest,  dyspnea on exertion, cough, sputum, wheezing.  GI: See history of present illness. GU:  Negative for dysuria, hematuria, urinary incontinence, urinary frequency, nocturnal urination.  MS: Negative for joint pain, low back pain.  Derm: Negative for rash or itching.  Neuro: Negative for weakness, abnormal sensation, seizure, frequent headaches, memory loss, confusion.  Psych: Negative for anxiety, depression, suicidal ideation, hallucinations.  Endo: Negative for unusual weight change.  Heme: Negative for bruising or bleeding. Allergy: Negative for rash or hives.    Physical Examination:  BP 117/76  Pulse 71  Temp(Src) 98.8 F (37.1 C) (Oral)  Ht 5\' 4"  (1.626 m)  Wt 182 lb 12.8 oz (82.918 kg)  BMI 31.36 kg/m2   General: Well-nourished, well-developed in no acute distress.  Head: Normocephalic, atraumatic.   Eyes: Conjunctiva pink, no  icterus. Mouth: Oropharyngeal mucosa moist and pink , no lesions erythema or exudate. Neck: Supple without thyromegaly, masses, or lymphadenopathy.  Lungs: Clear to auscultation bilaterally.  Heart: Regular rate and rhythm, no murmurs rubs or gallops.  Abdomen: Bowel sounds are normal, nontender, nondistended, no hepatosplenomegaly or masses, no abdominal bruits or    hernia , no rebound or guarding.   Rectal: Not performed Extremities: No lower extremity edema. No clubbing or deformities.  Neuro: Alert and oriented x 4 , grossly normal neurologically.  Skin: Warm and dry, no rash or jaundice.   Psych: Alert and cooperative, normal mood and affect.

## 2014-02-07 NOTE — Patient Instructions (Signed)
1. Colonoscopy with Dr. Jena Gaussourk to schedule. Please see separate instructions.

## 2014-02-07 NOTE — Assessment & Plan Note (Signed)
[age] year old lady who presents for routine followup of chronic GERD. Doing very well on over-the-counter Zegerid. She is due for average risk screening colonoscopy. Plan for colonoscopy with Dr. Jena Gaussourk in the near future.  I have discussed the risks, alternatives, benefits with regards to but not limited to the risk of reaction to medication, bleeding, infection, perforation and the patient is agreeable to proceed. Written consent to be obtained.   Return for OV in 2 years for GERD.

## 2014-02-12 NOTE — Progress Notes (Signed)
Cc to PCP 

## 2014-02-15 ENCOUNTER — Encounter (HOSPITAL_COMMUNITY): Payer: Self-pay

## 2014-03-04 ENCOUNTER — Ambulatory Visit (HOSPITAL_COMMUNITY)
Admission: RE | Admit: 2014-03-04 | Discharge: 2014-03-04 | Disposition: A | Discharge status: Home / Self Care | Payer: Medicare Other | Source: Ambulatory Visit | Attending: Internal Medicine | Admitting: Internal Medicine

## 2014-03-04 ENCOUNTER — Encounter (HOSPITAL_COMMUNITY): Payer: Self-pay

## 2014-03-04 ENCOUNTER — Encounter (HOSPITAL_COMMUNITY)
Admission: RE | Disposition: A | Discharge status: Home / Self Care | Payer: Self-pay | Source: Ambulatory Visit | Attending: Internal Medicine

## 2014-03-04 DIAGNOSIS — Z79899 Other long term (current) drug therapy: Secondary | ICD-10-CM | POA: Insufficient documentation

## 2014-03-04 DIAGNOSIS — K219 Gastro-esophageal reflux disease without esophagitis: Secondary | ICD-10-CM | POA: Diagnosis not present

## 2014-03-04 DIAGNOSIS — Z1211 Encounter for screening for malignant neoplasm of colon: Secondary | ICD-10-CM | POA: Diagnosis not present

## 2014-03-04 HISTORY — PX: COLONOSCOPY: SHX5424

## 2014-03-04 SURGERY — COLONOSCOPY
Anesthesia: Moderate Sedation

## 2014-03-04 MED ORDER — MEPERIDINE HCL 100 MG/ML IJ SOLN
INTRAMUSCULAR | Status: AC
  Filled 2014-03-04: qty 2

## 2014-03-04 MED ORDER — ONDANSETRON HCL 4 MG/2ML IJ SOLN
INTRAMUSCULAR | Status: AC
  Filled 2014-03-04: qty 2

## 2014-03-04 MED ORDER — MEPERIDINE HCL 100 MG/ML IJ SOLN
INTRAMUSCULAR | Status: DC | PRN
  Administered 2014-03-04: 50 mg via INTRAVENOUS

## 2014-03-04 MED ORDER — SODIUM CHLORIDE 0.9 % IV SOLN
INTRAVENOUS | Status: DC
  Administered 2014-03-04: 07:00:00 via INTRAVENOUS

## 2014-03-04 MED ORDER — STERILE WATER FOR IRRIGATION IR SOLN
Status: DC | PRN
  Administered 2014-03-04: 08:00:00

## 2014-03-04 MED ORDER — ONDANSETRON HCL 4 MG/2ML IJ SOLN
INTRAMUSCULAR | Status: DC | PRN
  Administered 2014-03-04: 4 mg via INTRAVENOUS

## 2014-03-04 MED ORDER — MIDAZOLAM HCL 5 MG/5ML IJ SOLN
INTRAMUSCULAR | Status: AC
  Filled 2014-03-04: qty 10

## 2014-03-04 MED ORDER — MIDAZOLAM HCL 5 MG/5ML IJ SOLN
INTRAMUSCULAR | Status: DC | PRN
  Administered 2014-03-04 (×2): 1 mg via INTRAVENOUS
  Administered 2014-03-04: 2 mg via INTRAVENOUS

## 2014-03-04 NOTE — Op Note (Signed)
[facility] [address] [place], [address]   COLONOSCOPY PROCEDURE REPORT  PATIENT: Mikayla [name], Mikayla [name]  MR#:         [id number] BIRTHDATE: 1940/08/16 , 72  yrs. old GENDER: Female ENDOSCOPIST: R.  Roetta Sessions, MD FACP FACG REFERRED BY:  Doreen Beam, M.D. PROCEDURE DATE:  03/04/2014 PROCEDURE:     Screening colonoscopy  INDICATIONS: Average risk colorectal cancer screening examination  INFORMED CONSENT:  The risks, benefits, alternatives and imponderables including but not limited to bleeding, perforation as well as the possibility of a missed lesion have been reviewed.  The potential for biopsy, lesion removal, etc. have also been discussed.  Questions have been answered.  All parties agreeable. Please see the history and physical in the medical record for more information.  MEDICATIONS: Versed 4 mg IV and Demerol 50 mg IV in divided doses. Zofran 4 mg IV.  DESCRIPTION OF PROCEDURE:  After a digital rectal exam was performed, the EC-[id number] ([id number])  colonoscope was advanced from the anus through the rectum and colon to the area of the cecum, ileocecal valve and appendiceal orifice.  The cecum was deeply intubated.  These structures were well-seen and photographed for the record.  From the level of the cecum and ileocecal valve, the scope was slowly and cautiously withdrawn.  The mucosal surfaces were carefully surveyed utilizing scope tip deflection to facilitate fold flattening as needed.  The scope was pulled down into the rectum where a thorough examination including retroflexion was performed.    FINDINGS:  Adequate preparation. Normal rectum. Abnormal-appearing colonic mucosa  THERAPEUTIC / DIAGNOSTIC MANEUVERS PERFORMED:  none  COMPLICATIONS: none  CECAL WITHDRAWAL TIME:  7 minutes  IMPRESSION:  Normal colonoscopy  RECOMMENDATIONS: No future colonoscopy unless patient develops new symptoms.   _______________________________ eSigned:  R.  Roetta Sessions, MD FACP [facility] 03/04/2014 8:14 AM   CC:

## 2014-03-04 NOTE — H&P (View-Only) (Signed)
Primary Care Physician:  Ignatius Specking., MD  Primary Gastroenterologist:  Roetta Sessions, MD   Chief Complaint  Patient presents with  . Follow-up    doing ok    HPI:  Mikayla [name] is a 73 y.o. female here for two-year followup of chronic GERD. She does very well on over-the-counter Zegerid. Denies any breakthrough heartburn, dysphagia, vomiting, weight loss, anorexia, abdominal pain. Bowel movements are regular. No blood in the stool or melena. She is due for average risk colon cancer screening.    Current Outpatient Prescriptions  Medication Sig Dispense Refill  . Ascorbic Acid (VITAMIN C) 1000 MG tablet Take 1,000 mg by mouth daily.      . cycloSPORINE (RESTASIS) 0.05 % ophthalmic emulsion 1 drop 2 (two) times daily.      Marland Kitchen LUMIGAN 0.01 % SOLN Place 1 drop into both eyes at bedtime.       . Multiple Vitamins-Minerals (ICAPS) CAPS Take by mouth.      Maxwell Caul Bicarbonate (ZEGERID) 20-1100 MG CAPS capsule Take 1 capsule by mouth daily before breakfast.      . Probiotic Product (ALIGN) 4 MG CAPS Take 4 mg by mouth daily.      Marland Kitchen UNABLE TO FIND Take 81 mg by mouth daily. Woman's Bayer      . vitamin B-12 (CYANOCOBALAMIN) 1000 MCG tablet Take 1,000 mcg by mouth daily.      . Vitamins A & D (VITAMIN A & D PO) Take by mouth daily.      Marland Kitchen zinc sulfate 220 MG capsule Take 220 mg by mouth daily.       No current facility-administered medications for this visit.    Allergies as of 02/07/2014 - Review Complete 02/07/2014  Allergen Reaction Noted  . Niacin    . Penicillins Other (See Comments)     Past Medical History  Diagnosis Date  . GERD (gastroesophageal reflux disease)   . Glaucoma   . IBS (irritable bowel syndrome)   . Hiatal hernia     small  . Internal hemorrhoids   . Hepatic hemangioma     Past Surgical History  Procedure Laterality Date  . Right oophorectomy    . Left oophorectomy      partial  . Appendectomy    . Esophagogastroduodenoscopy  01/08/2005     Dr. Jena Gauss- multiple distal esophageal erosions consistent with erosive reflux esophagitis, small hiatal hernia o/w normal stomach  . Colonoscopy  03/06/2004    Dr. Jena Gauss- internal hemorrhoids, o/w normal colon.    Family History  Problem Relation Age of Onset  . Colon cancer Neg Hx     History   Social History  . Marital Status: Widowed    Spouse Name: N/A    Number of Children: N/A  . Years of Education: N/A   Occupational History  . Not on file.   Social History Main Topics  . Smoking status: Never Smoker   . Smokeless tobacco: Not on file  . Alcohol Use: No  . Drug Use: No  . Sexual Activity: Not on file   Other Topics Concern  . Not on file   Social History Narrative  . No narrative on file      ROS:  General: Negative for anorexia, weight loss, fever, chills, fatigue, weakness. Eyes: Negative for vision changes.  ENT: Negative for hoarseness, difficulty swallowing , nasal congestion. CV: Negative for chest pain, angina, palpitations, dyspnea on exertion, peripheral edema.  Respiratory: Negative for dyspnea at rest,  dyspnea on exertion, cough, sputum, wheezing.  GI: See history of present illness. GU:  Negative for dysuria, hematuria, urinary incontinence, urinary frequency, nocturnal urination.  MS: Negative for joint pain, low back pain.  Derm: Negative for rash or itching.  Neuro: Negative for weakness, abnormal sensation, seizure, frequent headaches, memory loss, confusion.  Psych: Negative for anxiety, depression, suicidal ideation, hallucinations.  Endo: Negative for unusual weight change.  Heme: Negative for bruising or bleeding. Allergy: Negative for rash or hives.    Physical Examination:  BP 117/76  Pulse 71  Temp(Src) 98.8 F (37.1 C) (Oral)  Ht  (1.626 m)  Wt 182 lb 12.8 oz (82.918 kg)  BMI 31.36 kg/m2   General: Well-nourished, well-developed in no acute distress.  Head: Normocephalic, atraumatic.   Eyes: Conjunctiva pink, no  icterus. Mouth: Oropharyngeal mucosa moist and pink , no lesions erythema or exudate. Neck: Supple without thyromegaly, masses, or lymphadenopathy.  Lungs: Clear to auscultation bilaterally.  Heart: Regular rate and rhythm, no murmurs rubs or gallops.  Abdomen: Bowel sounds are normal, nontender, nondistended, no hepatosplenomegaly or masses, no abdominal bruits or    hernia , no rebound or guarding.   Rectal: Not performed Extremities: No lower extremity edema. No clubbing or deformities.  Neuro: Alert and oriented x 4 , grossly normal neurologically.  Skin: Warm and dry, no rash or jaundice.   Psych: Alert and cooperative, normal mood and affect.

## 2014-03-04 NOTE — Discharge Instructions (Signed)
°  Colonoscopy °Discharge Instructions ° °Read the instructions outlined below and refer to this sheet in the next few weeks. These discharge instructions provide you with general information on caring for yourself after you leave the hospital. Your doctor may also give you specific instructions. While your treatment has been planned according to the most current medical practices available, unavoidable complications occasionally occur. If you have any problems or questions after discharge, call Dr. Jobe Mutch at [phone]. °ACTIVITY °· You may resume your regular activity, but move at a slower pace for the next 24 hours.  °· Take frequent rest periods for the next 24 hours.  °· Walking will help get rid of the air and reduce the bloated feeling in your belly (abdomen).  °· No driving for 24 hours (because of the medicine (anesthesia) used during the test).   °· Do not sign any important legal documents or operate any machinery for 24 hours (because of the anesthesia used during the test).  °NUTRITION °· Drink plenty of fluids.  °· You may resume your normal diet as instructed by your doctor.  °· Begin with a light meal and progress to your normal diet. Heavy or fried foods are harder to digest and may make you feel sick to your stomach (nauseated).  °· Avoid alcoholic beverages for 24 hours or as instructed.  °MEDICATIONS °· You may resume your normal medications unless your doctor tells you otherwise.  °WHAT YOU CAN EXPECT TODAY °· Some feelings of bloating in the abdomen.  °· Passage of more gas than usual.  °· Spotting of blood in your stool or on the toilet paper.  °IF YOU HAD POLYPS REMOVED DURING THE COLONOSCOPY: °· No aspirin products for 7 days or as instructed.  °· No alcohol for 7 days or as instructed.  °· Eat a soft diet for the next 24 hours.  °FINDING OUT THE RESULTS OF YOUR TEST °Not all test results are available during your visit. If your test results are not back during the visit, make an appointment  with your caregiver to find out the results. Do not assume everything is normal if you have not heard from your caregiver or the medical facility. It is important for you to follow up on all of your test results.  °SEEK IMMEDIATE MEDICAL ATTENTION IF: °· You have more than a spotting of blood in your stool.  °· Your belly is swollen (abdominal distention).  °· You are nauseated or vomiting.  °· You have a temperature over 101.  °· You have abdominal pain or discomfort that is severe or gets worse throughout the day.  ° ° °Your colonoscopy was normal today ° °I do not recommend a future colonoscopy unless you develop new symptoms. ° ° ° ° °

## 2014-03-04 NOTE — Interval H&P Note (Signed)
History and Physical Interval Note:  03/04/2014 7:36 AM  Mikayla [name]  has presented today for surgery, with the diagnosis of SCREENING COLONOSCOPY  The various methods of treatment have been discussed with the patient and family. After consideration of risks, benefits and other options for treatment, the patient has consented to  Procedure(s) with comments: COLONOSCOPY (N/A) - 730 as a surgical intervention .  The patient's history has been reviewed, patient examined, no change in status, stable for surgery.  I have reviewed the patient's chart and labs.  Questions were answered to the patient's satisfaction.     Mikayla [name]  No change. Colonoscopy per plan.The risks, benefits, limitations, alternatives and imponderables have been reviewed with the patient. Questions have been answered. All parties are agreeable.

## 2014-03-06 ENCOUNTER — Encounter (HOSPITAL_COMMUNITY): Payer: Self-pay | Admitting: Internal Medicine

## 2014-04-25 DIAGNOSIS — H47233 Glaucomatous optic atrophy, bilateral: Secondary | ICD-10-CM | POA: Diagnosis not present

## 2014-04-25 DIAGNOSIS — H4011X3 Primary open-angle glaucoma, severe stage: Secondary | ICD-10-CM | POA: Diagnosis not present

## 2014-04-25 DIAGNOSIS — H25042 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, left eye: Secondary | ICD-10-CM | POA: Diagnosis not present

## 2014-10-17 DIAGNOSIS — H4011X3 Primary open-angle glaucoma, severe stage: Secondary | ICD-10-CM | POA: Diagnosis not present

## 2014-10-17 DIAGNOSIS — H25042 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, left eye: Secondary | ICD-10-CM | POA: Diagnosis not present

## 2014-11-06 DIAGNOSIS — H4011X0 Primary open-angle glaucoma, stage unspecified: Secondary | ICD-10-CM | POA: Diagnosis not present

## 2014-11-06 DIAGNOSIS — H4011X2 Primary open-angle glaucoma, moderate stage: Secondary | ICD-10-CM | POA: Diagnosis not present

## 2015-04-16 DIAGNOSIS — H25042 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, left eye: Secondary | ICD-10-CM | POA: Diagnosis not present

## 2015-04-16 DIAGNOSIS — H401133 Primary open-angle glaucoma, bilateral, severe stage: Secondary | ICD-10-CM | POA: Diagnosis not present

## 2015-05-06 ENCOUNTER — Encounter (HOSPITAL_COMMUNITY): Payer: Self-pay | Admitting: *Deleted

## 2015-05-06 ENCOUNTER — Other Ambulatory Visit: Payer: Self-pay | Admitting: Ophthalmology

## 2015-05-06 NOTE — Progress Notes (Signed)
Pt made aware to arrive at 6:30 A.M. tomorrow.

## 2015-05-06 NOTE — H&P (Signed)
History & Physical:   DATE:   04-16-2015  NAME:  Mikayla [name], Mikayla [name]      [phone]       HISTORY OF PRESENT ILLNESS: Chief Eye Complaints glaucoma blurry vision OS   Patient states I haven't notice any changes in vision since last visit. Patient states Dr Mayford Knifeurner did not change glasses Rx.    HPI: EYES: Reports symptoms of vision disturbances .  ( see questionaire)    LOCATION:   BOTH EYES        QUALITY/COURSE:   Reports condition is unchanged.        INTENSITY/SEVERITY:    Reports measurement ( or degree) as moderate.      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   ICD10:   ICD9: 377.14  Onset: 03/20/2013 17:13    OS  SURGERIES:  MEDICATIONS: Lumigan: 0.01% solution SIG-  1 gtt in each affected eye once a day (in the evening) for 30 days   Dorzolamide Hydrochloride: 2% solution  SIG-  1 gtt OU TID   TID OU  Restasis: 0.05% emulsion SIG-  1 gtt BID OU   Ciloxan (Ciprofloxacin) Solution: 0.3% solution SIG-  1 drop in OS BID   Ilevro: 0.3% suspension SIG-  1 drop in OS QD  REVIEW OF SYSTEMS: ROS:   GEN- Constitutional: HENT: GEN - Endocrine: Reports symptoms of LUNGS/Respiratory:  HEART/Cardiovascular: Reports symptoms of ABD/Gastrointestinal: Musculoskeletal (BJE): NEURO/Neurological: PSYCH/Psychiatric:    Is the pt oriented to time, place, person? YES Mood normal    TOBACCO: No exposure to tobacco.      Never smoker   ICD10: Z87.898 ICD9: V13.89  Tobacco use:     SOCIAL HISTORY: Widower.  (now Engaged)  Herbalisttarter Pick List - Social History  FAMILY HISTORY:   Family History - 1st Degree Relatives:  Mother  dead.    ALLERGIES: alphagan PENICILLIN:   Allergic contact dermatitis to latex   ICD10: Z91.040 ICD9: V15.07  PHYSICAL EXAMINATION: VS: BMI: 30.9.  BP: 118/118.  H: 60.00 in.  P: 60 /min.  RR: 20 /min.  W: 158lbs 0oz.    Va   03/20/2013 16:14   OD:cc 20/30 PH 20/ UJ:WJ19/147OS:cc20/200 PH 20/NI  EYEGLASSES:  OD:-3.25+2.00x013                                               OS:-3.50+1.25x170 ADD:+2.50  MR  OD:NI OS:NI ADD:  Motility: full  PUPILS: 4mm   EYELIDS & OCULAR ADNEXA:  normal    SLE: Conjunctiva: quiet  Cornea: arcus   Anterior Chamber:deep and quiet  OU  Iris:gray  Lens: +1  nuclear  sclerosis  OU  Vitreous:  CCT:  Ta   in mmHg    OD: 12         OS: 16 Time  03/20/2013 16:12  Dilation: os  Fundus:  optic nerve:   OD:thin temporal rim 85% cup                                             OS:90% cup thin nerve  onferior rim loss    Macula OD/OS:normal OU                                                 Vessels:normal OU   Periphery:  VEP abnormal amp & latency OU  ADMITTING DIAGNOSIS: 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, left eye   ICD10: H25.042  :  Primary open-angle glaucoma, severe stage   ICD10: H40.11X3  ICD9:       Poor vision OS apparently from advanced glaucoma OS  Glaucomatous optic atrophy, bilateral   ICD10: H47.233  ICD9:   Onset: 04/25/2014 15:00   Dry eye syndrome   ICD10: H04.129  ICD9: 375.15  Onset: 12/25/2013 15:00  Initial Date:  SURGICAL TREATMENT PLAN: Visual Fields  today  patient would require general anthesia for surgery    phaco emulsion cataract extraction with  intraocular lens implant  &   trabeculectomy  OS when  patient  desires    Risk and benefits of surgery have been reviewed with the patient and the patient agrees to proceed with the surgical procedure.     Actions:     Handouts: glaucoma , what is glaucoma?, glaucoma treatment, glaucoma , what is glaucoma?, glaucoma treatment, Script Guide -  Lumigan, glaucoma , what is glaucoma?, glaucoma treatment.    ___________________________ Mikayla Wimmer, [name] Starter - Inactive Problems:  

## 2015-05-07 ENCOUNTER — Encounter (HOSPITAL_COMMUNITY): Payer: Self-pay | Admitting: Certified Registered Nurse Anesthetist

## 2015-05-07 ENCOUNTER — Ambulatory Visit (HOSPITAL_COMMUNITY): Payer: Medicare Other | Admitting: Certified Registered Nurse Anesthetist

## 2015-05-07 ENCOUNTER — Ambulatory Visit (HOSPITAL_COMMUNITY)
Admission: RE | Admit: 2015-05-07 | Discharge: 2015-05-07 | Disposition: A | Discharge status: Home / Self Care | Payer: Medicare Other | Source: Ambulatory Visit | Attending: Ophthalmology | Admitting: Ophthalmology

## 2015-05-07 ENCOUNTER — Other Ambulatory Visit: Payer: Self-pay | Admitting: Ophthalmology

## 2015-05-07 ENCOUNTER — Encounter (HOSPITAL_COMMUNITY)
Admission: RE | Disposition: A | Discharge status: Home / Self Care | Payer: Self-pay | Source: Ambulatory Visit | Attending: Ophthalmology

## 2015-05-07 DIAGNOSIS — K219 Gastro-esophageal reflux disease without esophagitis: Secondary | ICD-10-CM | POA: Diagnosis not present

## 2015-05-07 DIAGNOSIS — H2512 Age-related nuclear cataract, left eye: Secondary | ICD-10-CM | POA: Insufficient documentation

## 2015-05-07 DIAGNOSIS — H409 Unspecified glaucoma: Secondary | ICD-10-CM | POA: Diagnosis not present

## 2015-05-07 DIAGNOSIS — H25042 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, left eye: Secondary | ICD-10-CM | POA: Diagnosis not present

## 2015-05-07 DIAGNOSIS — K589 Irritable bowel syndrome without diarrhea: Secondary | ICD-10-CM | POA: Diagnosis not present

## 2015-05-07 DIAGNOSIS — M1990 Unspecified osteoarthritis, unspecified site: Secondary | ICD-10-CM | POA: Diagnosis not present

## 2015-05-07 DIAGNOSIS — H401133 Primary open-angle glaucoma, bilateral, severe stage: Secondary | ICD-10-CM | POA: Diagnosis not present

## 2015-05-07 HISTORY — DX: Other chronic pain: G89.29

## 2015-05-07 HISTORY — DX: Unspecified osteoarthritis, unspecified site: M19.90

## 2015-05-07 HISTORY — DX: Pneumonia, unspecified organism: J18.9

## 2015-05-07 HISTORY — DX: Personal history of other diseases of the nervous system and sense organs: Z86.69

## 2015-05-07 HISTORY — DX: Dorsalgia, unspecified: M54.9

## 2015-05-07 HISTORY — DX: Personal history of other diseases of the respiratory system: Z87.09

## 2015-05-07 HISTORY — PX: CATARACT EXTRACTION W/PHACO: SHX586

## 2015-05-07 LAB — CBC
HEMATOCRIT: 40.8 % (ref 36.0–46.0)
HEMOGLOBIN: 13.4 g/dL (ref 12.0–15.0)
MCH: 30.1 pg (ref 26.0–34.0)
MCHC: 32.8 g/dL (ref 30.0–36.0)
MCV: 91.7 fL (ref 78.0–100.0)
Platelets: 165 10*3/uL (ref 150–400)
RBC: 4.45 MIL/uL (ref 3.87–5.11)
RDW: 12.9 % (ref 11.5–15.5)
WBC: 4.9 10*3/uL (ref 4.0–10.5)

## 2015-05-07 SURGERY — PHACOEMULSIFICATION, CATARACT, WITH IOL INSERTION
Anesthesia: General | Site: Eye | Laterality: Left

## 2015-05-07 MED ORDER — TOBRAMYCIN 0.3 % OP OINT
TOPICAL_OINTMENT | OPHTHALMIC | Status: DC | PRN
  Administered 2015-05-07: 1 via OPHTHALMIC

## 2015-05-07 MED ORDER — FLUORESCEIN SODIUM 1 MG OP STRP
ORAL_STRIP | OPHTHALMIC | Status: DC | PRN
  Administered 2015-05-07: 1 via OPHTHALMIC

## 2015-05-07 MED ORDER — TROPICAMIDE 1 % OP SOLN
1.0000 [drp] | OPHTHALMIC | Status: AC | PRN
  Administered 2015-05-07 (×3): 1 [drp] via OPHTHALMIC
  Filled 2015-05-07: qty 3

## 2015-05-07 MED ORDER — PROMETHAZINE HCL 25 MG/ML IJ SOLN: 6.2500 mg | INTRAMUSCULAR | Status: DC | PRN

## 2015-05-07 MED ORDER — 0.9 % SODIUM CHLORIDE (POUR BTL) OPTIME
TOPICAL | Status: DC | PRN
  Administered 2015-05-07: 1000 mL

## 2015-05-07 MED ORDER — LIDOCAINE HCL (CARDIAC) 20 MG/ML IV SOLN
INTRAVENOUS | Status: DC | PRN
  Administered 2015-05-07: 50 mg via INTRAVENOUS

## 2015-05-07 MED ORDER — ACETYLCHOLINE CHLORIDE 20 MG IO SOLR
INTRAOCULAR | Status: DC | PRN
  Administered 2015-05-07: 20 mg via INTRAOCULAR

## 2015-05-07 MED ORDER — GENTAMICIN SULFATE 40 MG/ML IJ SOLN
INTRAMUSCULAR | Status: AC
  Filled 2015-05-07: qty 2

## 2015-05-07 MED ORDER — FENTANYL CITRATE (PF) 250 MCG/5ML IJ SOLN
INTRAMUSCULAR | Status: AC
  Filled 2015-05-07: qty 5

## 2015-05-07 MED ORDER — ONDANSETRON HCL 4 MG/2ML IJ SOLN
INTRAMUSCULAR | Status: AC
Start: 2015-05-07 — End: 2015-05-07
  Filled 2015-05-07: qty 2

## 2015-05-07 MED ORDER — ACETYLCHOLINE CHLORIDE 20 MG IO SOLR
INTRAOCULAR | Status: AC
  Filled 2015-05-07: qty 1

## 2015-05-07 MED ORDER — MIDAZOLAM HCL 2 MG/2ML IJ SOLN
INTRAMUSCULAR | Status: AC
  Filled 2015-05-07: qty 4

## 2015-05-07 MED ORDER — TETRACAINE HCL 0.5 % OP SOLN
1.0000 [drp] | OPHTHALMIC | Status: AC
  Administered 2015-05-07: 1 [drp] via OPHTHALMIC
  Filled 2015-05-07 (×2): qty 2

## 2015-05-07 MED ORDER — EPINEPHRINE HCL 1 MG/ML IJ SOLN
INTRAMUSCULAR | Status: AC
  Filled 2015-05-07: qty 1

## 2015-05-07 MED ORDER — FENTANYL CITRATE (PF) 100 MCG/2ML IJ SOLN
INTRAMUSCULAR | Status: DC | PRN
  Administered 2015-05-07: 50 ug via INTRAVENOUS
  Administered 2015-05-07 (×2): 25 ug via INTRAVENOUS

## 2015-05-07 MED ORDER — TRIAMCINOLONE ACETONIDE 40 MG/ML IJ SUSP
INTRAMUSCULAR | Status: AC
Start: 2015-05-07 — End: 2015-05-07
  Filled 2015-05-07: qty 5

## 2015-05-07 MED ORDER — LACTATED RINGERS IV SOLN: INTRAVENOUS | Status: DC

## 2015-05-07 MED ORDER — FENTANYL CITRATE (PF) 100 MCG/2ML IJ SOLN: 25.0000 ug | INTRAMUSCULAR | Status: DC | PRN

## 2015-05-07 MED ORDER — LIDOCAINE HCL (CARDIAC) 20 MG/ML IV SOLN
INTRAVENOUS | Status: AC
  Filled 2015-05-07: qty 5

## 2015-05-07 MED ORDER — LIDOCAINE-EPINEPHRINE 2 %-1:100000 IJ SOLN
INTRAMUSCULAR | Status: AC
  Filled 2015-05-07: qty 1

## 2015-05-07 MED ORDER — BSS IO SOLN
INTRAOCULAR | Status: AC
  Filled 2015-05-07: qty 500

## 2015-05-07 MED ORDER — MEPERIDINE HCL 25 MG/ML IJ SOLN: 6.2500 mg | INTRAMUSCULAR | Status: DC | PRN

## 2015-05-07 MED ORDER — BUPIVACAINE HCL (PF) 0.75 % IJ SOLN
INTRAMUSCULAR | Status: AC
  Filled 2015-05-07: qty 10

## 2015-05-07 MED ORDER — PROPOFOL 10 MG/ML IV BOLUS
INTRAVENOUS | Status: AC
  Filled 2015-05-07: qty 20

## 2015-05-07 MED ORDER — BSS IO SOLN
INTRAOCULAR | Status: DC | PRN
Start: 2015-05-07 — End: 2015-05-07
  Administered 2015-05-07: 500 mL via INTRAOCULAR

## 2015-05-07 MED ORDER — BSS IO SOLN
INTRAOCULAR | Status: AC
  Filled 2015-05-07: qty 15

## 2015-05-07 MED ORDER — PHENYLEPHRINE HCL 10 MG/ML IJ SOLN
INTRAMUSCULAR | Status: AC
  Filled 2015-05-07: qty 1

## 2015-05-07 MED ORDER — ONDANSETRON HCL 4 MG/2ML IJ SOLN
INTRAMUSCULAR | Status: DC | PRN
Start: 2015-05-07 — End: 2015-05-07
  Administered 2015-05-07: 4 mg via INTRAVENOUS

## 2015-05-07 MED ORDER — PHENYLEPHRINE HCL 2.5 % OP SOLN
1.0000 [drp] | OPHTHALMIC | Status: AC | PRN
  Administered 2015-05-07 (×3): 1 [drp] via OPHTHALMIC
  Filled 2015-05-07: qty 2

## 2015-05-07 MED ORDER — SODIUM HYALURONATE 10 MG/ML IO SOLN
INTRAOCULAR | Status: AC
  Filled 2015-05-07: qty 0.85

## 2015-05-07 MED ORDER — FLUORESCEIN SODIUM 1 MG OP STRP
ORAL_STRIP | OPHTHALMIC | Status: AC
  Filled 2015-05-07: qty 2

## 2015-05-07 MED ORDER — SODIUM HYALURONATE 10 MG/ML IO SOLN
INTRAOCULAR | Status: DC | PRN
  Administered 2015-05-07: 0.85 mL via INTRAOCULAR

## 2015-05-07 MED ORDER — LIDOCAINE HCL 2 % IJ SOLN
INTRAMUSCULAR | Status: AC
  Filled 2015-05-07: qty 20

## 2015-05-07 MED ORDER — NA CHONDROIT SULF-NA HYALURON 40-30 MG/ML IO SOLN
INTRAOCULAR | Status: DC | PRN
  Administered 2015-05-07: 0.75 mL via INTRAOCULAR

## 2015-05-07 MED ORDER — MITOMYCIN 0.2 MG OP KIT
PACK | OPHTHALMIC | Status: DC | PRN
  Administered 2015-05-07: .4 mg via OPHTHALMIC

## 2015-05-07 MED ORDER — PHENYLEPHRINE HCL 10 MG/ML IJ SOLN
INTRAMUSCULAR | Status: DC | PRN
  Administered 2015-05-07 (×6): 40 ug via INTRAVENOUS

## 2015-05-07 MED ORDER — TOBRAMYCIN-DEXAMETHASONE 0.3-0.1 % OP OINT
TOPICAL_OINTMENT | OPHTHALMIC | Status: AC
Start: 2015-05-07 — End: 2015-05-07
  Filled 2015-05-07: qty 3.5

## 2015-05-07 MED ORDER — EPINEPHRINE HCL 1 MG/ML IJ SOLN
INTRAMUSCULAR | Status: DC | PRN
  Administered 2015-05-07: .3 mL

## 2015-05-07 MED ORDER — MITOMYCIN 0.2 MG OP KIT
0.2000 mg | PACK | Freq: Once | OPHTHALMIC | Status: DC
  Filled 2015-05-07: qty 1

## 2015-05-07 MED ORDER — CYCLOPENTOLATE HCL 1 % OP SOLN
1.0000 [drp] | OPHTHALMIC | Status: AC | PRN
  Administered 2015-05-07 (×3): 1 [drp] via OPHTHALMIC
  Filled 2015-05-07: qty 2

## 2015-05-07 MED ORDER — TETRACAINE HCL 0.5 % OP SOLN
OPHTHALMIC | Status: AC
  Filled 2015-05-07: qty 2

## 2015-05-07 MED ORDER — NA CHONDROIT SULF-NA HYALURON 40-30 MG/ML IO SOLN
INTRAOCULAR | Status: AC
Start: 2015-05-07 — End: 2015-05-07
  Filled 2015-05-07: qty 0.5

## 2015-05-07 MED ORDER — GATIFLOXACIN 0.5 % OP SOLN
1.0000 [drp] | OPHTHALMIC | Status: AC | PRN
  Administered 2015-05-07 (×3): 1 [drp] via OPHTHALMIC
  Filled 2015-05-07: qty 2.5

## 2015-05-07 MED ORDER — DEXAMETHASONE SODIUM PHOSPHATE 10 MG/ML IJ SOLN
INTRAMUSCULAR | Status: AC
  Filled 2015-05-07: qty 1

## 2015-05-07 MED ORDER — TRIAMCINOLONE ACETONIDE 40 MG/ML IJ SUSP
INTRAMUSCULAR | Status: DC | PRN
  Administered 2015-05-07: .1 mL

## 2015-05-07 MED ORDER — LACTATED RINGERS IV SOLN
INTRAVENOUS | Status: DC | PRN
  Administered 2015-05-07 (×2): via INTRAVENOUS

## 2015-05-07 MED ORDER — PROPOFOL 10 MG/ML IV BOLUS
INTRAVENOUS | Status: DC | PRN
  Administered 2015-05-07: 140 mg via INTRAVENOUS

## 2015-05-07 MED ORDER — PILOCARPINE HCL 4 % OP SOLN
OPHTHALMIC | Status: AC
  Filled 2015-05-07: qty 15

## 2015-05-07 MED ORDER — MIDAZOLAM HCL 5 MG/5ML IJ SOLN
INTRAMUSCULAR | Status: DC | PRN
  Administered 2015-05-07: 2 mg via INTRAVENOUS

## 2015-05-07 MED ORDER — HYALURONIDASE HUMAN 150 UNIT/ML IJ SOLN
INTRAMUSCULAR | Status: AC
  Filled 2015-05-07: qty 1

## 2015-05-07 MED ORDER — KETOROLAC TROMETHAMINE 0.5 % OP SOLN
1.0000 [drp] | OPHTHALMIC | Status: AC | PRN
  Administered 2015-05-07 (×3): 1 [drp] via OPHTHALMIC
  Filled 2015-05-07: qty 5

## 2015-05-07 SURGICAL SUPPLY — 45 items
APL SRG 3 HI ABS STRL LF PLS (MISCELLANEOUS) ×1
APPLICATOR COTTON TIP 6IN STRL (MISCELLANEOUS) ×3 IMPLANT
APPLICATOR DR MATTHEWS STRL (MISCELLANEOUS) ×3 IMPLANT
BLADE EYE CATARACT 19 1.4 BEAV (BLADE) ×2 IMPLANT
BLADE KERATOME 2.75 (BLADE) ×2 IMPLANT
BLADE KERATOME 2.75MM (BLADE) ×1
BLADE STAB KNIFE 45DEG (BLADE) ×2 IMPLANT
CANNULA ANTERIOR CHAMBER 27GA (MISCELLANEOUS) ×3 IMPLANT
CORDS BIPOLAR (ELECTRODE) ×2 IMPLANT
COVER MAYO STAND STRL (DRAPES) ×3 IMPLANT
DRAPE OPHTHALMIC 40X48 W POUCH (DRAPES) ×3 IMPLANT
DRAPE RETRACTOR (MISCELLANEOUS) ×3 IMPLANT
GLOVE BIO SURGEON STRL SZ8 (GLOVE) ×3 IMPLANT
GLOVE SURG SS PI 6.5 STRL IVOR (GLOVE) ×4 IMPLANT
GLOVE SURG SS PI 8.0 STRL IVOR (GLOVE) ×2 IMPLANT
GOWN STRL REUS W/ TWL LRG LVL3 (GOWN DISPOSABLE) ×2 IMPLANT
GOWN STRL REUS W/TWL LRG LVL3 (GOWN DISPOSABLE) ×6
KIT BASIN OR (CUSTOM PROCEDURE TRAY) ×3 IMPLANT
KIT ROOM TURNOVER OR (KITS) ×3 IMPLANT
LENS IOL ACRSF IQ PC 19.0 (Intraocular Lens) IMPLANT
LENS IOL ACRYSOF IQ POST 19.0 (Intraocular Lens) ×3 IMPLANT
NDL 18GX1X1/2 (RX/OR ONLY) (NEEDLE) ×1 IMPLANT
NDL 25GX 5/8IN NON SAFETY (NEEDLE) ×1 IMPLANT
NDL FILTER BLUNT 18X1 1/2 (NEEDLE) ×1 IMPLANT
NEEDLE 18GX1X1/2 (RX/OR ONLY) (NEEDLE) ×3 IMPLANT
NEEDLE 25GX 5/8IN NON SAFETY (NEEDLE) ×6 IMPLANT
NEEDLE FILTER BLUNT 18X 1/2SAF (NEEDLE) ×2
NEEDLE FILTER BLUNT 18X1 1/2 (NEEDLE) ×1 IMPLANT
NS IRRIG 1000ML POUR BTL (IV SOLUTION) ×3 IMPLANT
PACK CATARACT CUSTOM (CUSTOM PROCEDURE TRAY) ×3 IMPLANT
PAD ARMBOARD 7.5X6 YLW CONV (MISCELLANEOUS) ×3 IMPLANT
PAK PIK CVS CATARACT (OPHTHALMIC) ×3 IMPLANT
PENCIL BIPOLAR 25GA STR DISP (OPHTHALMIC RELATED) ×2 IMPLANT
SHUNT EXPRS GLAUCOMA MINI P200 (Intraocular Lens) ×2 IMPLANT
SUT ETHILON 10 0 CS140 6 (SUTURE) ×2 IMPLANT
SUT SILK 6 0 G 6 (SUTURE) ×2 IMPLANT
SUT VICRYL 9-0 (SUTURE) ×2 IMPLANT
SYR TB 1ML LUER SLIP (SYRINGE) ×3 IMPLANT
SYRINGE 20CC LL (MISCELLANEOUS) ×4 IMPLANT
TIP ABS 45DEG FLARED 0.9MM (TIP) ×3 IMPLANT
TOWEL OR 17X26 10 PK STRL BLUE (TOWEL DISPOSABLE) ×3 IMPLANT
TUBE CONNECTING 12'X1/4 (SUCTIONS) ×1
TUBE CONNECTING 12X1/4 (SUCTIONS) ×1 IMPLANT
WATER STERILE IRR 1000ML POUR (IV SOLUTION) ×3 IMPLANT
WIPE INSTRUMENT VISIWIPE 73X73 (MISCELLANEOUS) ×3 IMPLANT

## 2015-05-07 NOTE — Op Note (Signed)
Preoperative diagnosis: Visually significant cataract and uncontrolled glaucoma left eye Postop diagnosis: Same Procedure: Phaco emulsification with intraocular lens implant and insertion of express glaucoma device with mitomycin-C left eye Anesthesia: General Operations: None Assistant: Mindy Procedure: The patient is transported to the operating room and after induction of general anesthesia face was prepped and draped in the usual sterile fashion. With the surgeon sitting temporally the operating microscope in position a Weck-Cel sponges used to fixate the globe and a 15 blade was used to enter through inferior temporal clear cornea Viscoat was injected to inflate the anterior chamber. Using an additional Weck-Cel sponge to fixate the globe a 2.75 mm keratome blade was used in a stepwise fashion through temporal cornea to into the anterior chamber. A bent 25-gauge needle was used to incise anterior capsule and a continuous tear curvilinear capsulorrhexis was formed the anterior capsule was removed with the Utrata forceps. Reassess was used to hydrodissect the hydrodelineate the nucleus the phacoemulsification unit was then used to remove the epinucleus and sculpt a central trough the nucleus was divided into 3 quadrants all nuclear fragments were aspirated. The posterior capsule remained intact the irrigation-aspiration device was then use to strip cortical fibers from the posterior capsule after all cortical fibers had been removed Provisc was injected in the anterior chamber. The intraocular lens implant was examined and noted to have no defects the lens was an Alcon AcrySof SN 60 WF IQ lens 19.0 dpt lens SN number [id number] the lens is placed in the lens injector and injected and positioned with a Kuglen hook all in this Miostat was injected in the eye to constrict the pupil the eye was pressurized and a single 10-0 nylon suture was placed the operating microscope was in position that the 12:00  position a 6-0 nylon suture was passed through clear cornea to infraducted the eye additional Provisc was injected in the anterior chamber following this using the Hoskins forceps the conjunctiva was grasped in the superior nasal quadrant an incision was made at the superior nasal limbus using the [place] scissors bleeding was controlled with cautery and recession of T9 fibers with a Tooke blade. Following this a 45 blade was used to fashion a half thickness scleral flap with the base of 4 mm following this was necessary to change gowns the assistant and the surgeon changed gowns and gloves at this point. Following this the mitomycin-C was mixed in a concentration of 0.4 mg/mL the mitomycin-C was placed on 3 Gelfoam sponges the sponges were placed beneath the conjunctiva and allowed to stay on the eye for 3 minutes the sponges were then removed and the eye was irrigated with 30 mL of balanced salt solution the scleral flap was then dissected using the [place] forceps and agrees however blade following this a 25-gauge needle attached to Provisc was passed beneath the sclera at the limbus and into the anterior chamber the express glaucoma device was then examined and noted to have no defects the glaucoma filtration device was a version P2 100 reference [id number] it was rotated and passed beneath the tract created with a 25-gauge needle the scleral flap was then sutured with 3 interrupted 10-0 nylon sutures with one releasable suture following this the conjunctiva was closed with a 9-0 Vicryl suture on a BV 100 needle BSS was injected in the anterior chamber to remove the viscoelastic from the eye the bleb elevated the incision was checked and noted to be Seidel negative with fluorescing staining. A subconjunctival injection of  Kenalog 4 mg is given in a inferior nasal subconjunctival space following this all instruments were removed from the eye topical Tobrex ointment was applied to the eye a patch and Fox U  were placed and the patient returned to recovery area in stable condition. Rory Latanya Hemmer Junior M.D.

## 2015-05-07 NOTE — Interval H&P Note (Signed)
History and Physical Interval Note:  05/07/2015 8:32 AM  [name]  has presented today for surgery, with the diagnosis of POSTERIOR SUBCAPSULAR POLAR AGE CATARACT  The various methods of treatment have been discussed with the patient and family. After consideration of risks, benefits and other options for treatment, the patient has consented to  Procedure(s): CATARACT EXTRACTION PHACO AND INTRAOCULAR LENS PLACEMENT (IOC) WITH MINI TUBE (Left) as a surgical intervention .  The patient's history has been reviewed, patient examined, no change in status, stable for surgery.  I have reviewed the patient's chart and labs.  Questions were answered to the patient's satisfaction.     Mikayla [name]

## 2015-05-07 NOTE — Progress Notes (Signed)
Per Dr. Hart RochesterHollis, due to age, will obtain EKG. Pt reports last EKG was done in [place] about 10 years ago.

## 2015-05-07 NOTE — Anesthesia Postprocedure Evaluation (Signed)
  Anesthesia Post-op Note  Patient: Mikayla [name]  Procedure(s) Performed: Procedure(s): CATARACT EXTRACTION PHACO AND INTRAOCULAR LENS PLACEMENT (IOC) WITH MINI TUBE, PLACEMENT OF MITOSOL (Left)  Patient Location: PACU  Anesthesia Type:General  Level of Consciousness: awake, alert  and oriented  Airway and Oxygen Therapy: Patient Spontanous Breathing  Post-op Pain: none  Post-op Assessment: Post-op Vital signs reviewed and Patient's Cardiovascular Status Stable              Post-op Vital Signs: Reviewed and stable  Last Vitals:  Filed Vitals:   05/07/15 1219  BP: 134/62  Pulse: 77  Temp:   Resp: 20    Complications: No apparent anesthesia complications

## 2015-05-07 NOTE — H&P (View-Only) (Signed)
History & Physical:   DATE:   04-16-2015  NAME:  Mikayla [name], Mikayla [name]      [phone]       HISTORY OF PRESENT ILLNESS: Chief Eye Complaints glaucoma blurry vision OS   Patient states I haven't notice any changes in vision since last visit. Patient states Dr Mikayla [name] did not change glasses Rx.    HPI: EYES: Reports symptoms of vision disturbances .  ( see questionaire)    LOCATION:   BOTH EYES        QUALITY/COURSE:   Reports condition is unchanged.        INTENSITY/SEVERITY:    Reports measurement ( or degree) as moderate.      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   ICD10:   ICD9: 377.14  Onset: 03/20/2013 17:13    OS  SURGERIES:  MEDICATIONS: Lumigan: 0.01% solution SIG-  1 gtt in each affected eye once a day (in the evening) for 30 days   Dorzolamide Hydrochloride: 2% solution  SIG-  1 gtt OU TID   TID OU  Restasis: 0.05% emulsion SIG-  1 gtt BID OU   Ciloxan (Ciprofloxacin) Solution: 0.3% solution SIG-  1 drop in OS BID   Ilevro: 0.3% suspension SIG-  1 drop in OS QD  REVIEW OF SYSTEMS: ROS:   GEN- Constitutional: HENT: GEN - Endocrine: Reports symptoms of LUNGS/Respiratory:  HEART/Cardiovascular: Reports symptoms of ABD/Gastrointestinal: Musculoskeletal (BJE): NEURO/Neurological: PSYCH/Psychiatric:    Is the pt oriented to time, place, person? YES Mood normal    TOBACCO: No exposure to tobacco.      Never smoker   ICD10: Z87.898 ICD9: V13.89  Tobacco use:     SOCIAL HISTORY: Widower.  (now Engaged)  Herbalisttarter Pick List - Social History  FAMILY HISTORY:   Family History - 1st Degree Relatives:  Mother  dead.    ALLERGIES: alphagan PENICILLIN:   Allergic contact dermatitis to latex   ICD10: Z91.040 ICD9: V15.07  PHYSICAL EXAMINATION: VS: BMI: 30.9.  BP: 118/118.  H: 60.00 in.  P: 60 /min.  RR: 20 /min.  W: 158lbs 0oz.    Va   03/20/2013 16:14   OD:cc 20/30 PH 20/ UJ:WJ19/147OS:cc20/200 PH 20/NI  EYEGLASSES:  OD:-3.25+2.00x013                                               OS:-3.50+1.25x170 ADD:+2.50  MR  OD:NI OS:NI ADD:  Motility: full  PUPILS: 4mm   EYELIDS & OCULAR ADNEXA:  normal    SLE: Conjunctiva: quiet  Cornea: arcus   Anterior Chamber:deep and quiet  OU  Iris:gray  Lens: +1  nuclear  sclerosis  OU  Vitreous:  CCT:  Ta   in mmHg    OD: 12         OS: 16 Time  03/20/2013 16:12  Dilation: os  Fundus:  optic nerve:   WU:JWJX temporal rim 85% cup                                             OS:90% cup thin nerve  onferior rim loss    Macula OD/OS:normal OU                                                 Vessels:normal OU   Periphery:  VEP abnormal amp & latency OU  ADMITTING DIAGNOSIS: 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, left eye   ICD10: H25.042  :  Primary open-angle glaucoma, severe stage   ICD10: H40.11X3  ICD9:       Poor vision OS apparently from advanced glaucoma OS  Glaucomatous optic atrophy, bilateral   ICD10: H47.233  ICD9:   Onset: 04/25/2014 15:00   Dry eye syndrome   ICD10: H04.129  ICD9: 375.15  Onset: 12/25/2013 15:00  Initial Date:  SURGICAL TREATMENT PLAN: Patent attorney Fields  today  patient would require general anthesia for surgery    phaco emulsion cataract extraction with  intraocular lens implant  &   trabeculectomy  OS when  patient  desires    Risk and benefits of surgery have been reviewed with the patient and the patient agrees to proceed with the surgical procedure.     Actions:     Handouts: glaucoma , what is glaucoma?, glaucoma treatment, glaucoma , what is glaucoma?, glaucoma treatment, Script Guide -  Lumigan, glaucoma , what is glaucoma?, glaucoma treatment.    ___________________________ Chalmers Guest, [name] Starter - Inactive Problems:

## 2015-05-07 NOTE — Transfer of Care (Signed)
Immediate Anesthesia Transfer of Care Note  Patient: Mikayla ShowersRachel C Parthasarathy  Procedure(s) Performed: Procedure(s): CATARACT EXTRACTION PHACO AND INTRAOCULAR LENS PLACEMENT (IOC) WITH MINI TUBE, PLACEMENT OF MITOSOL (Left)  Patient Location: PACU  Anesthesia Type:General  Level of Consciousness: awake and alert   Airway & Oxygen Therapy: Patient Spontanous Breathing and Patient connected to nasal cannula oxygen  Post-op Assessment: Report given to RN and Post -op Vital signs reviewed and stable  Post vital signs: Reviewed and stable  Last Vitals:  Filed Vitals:   05/07/15 0633  BP: 140/70  Pulse: 80  Temp: 36.6 C  Resp: 18    Complications: No apparent anesthesia complications

## 2015-05-07 NOTE — Anesthesia Preprocedure Evaluation (Addendum)
Anesthesia Evaluation  Patient identified by MRN, date of birth, ID band Patient awake    Reviewed: Allergy & Precautions, NPO status , Patient's Chart, lab work & pertinent test results  Airway Mallampati: II  TM Distance: >3 FB Neck ROM: Full    Dental  (+) Teeth Intact   Pulmonary    breath sounds clear to auscultation       Cardiovascular negative cardio ROS   Rhythm:Regular Rate:Normal     Neuro/Psych  Neuromuscular disease negative psych ROS   GI/Hepatic Neg liver ROS, GERD  Medicated,  Endo/Other  negative endocrine ROS  Renal/GU negative Renal ROS  negative genitourinary   Musculoskeletal  (+) Arthritis ,   Abdominal   Peds negative pediatric ROS (+)  Hematology negative hematology ROS (+)   Anesthesia Other Findings   Reproductive/Obstetrics negative OB ROS                            No results found for: WBC, HGB, HCT, MCV, PLT   Anesthesia Physical Anesthesia Plan  ASA: II  Anesthesia Plan: General   Post-op Pain Management:    Induction: Intravenous  Airway Management Planned: Oral ETT  Additional Equipment:   Intra-op Plan:   Post-operative Plan: Extubation in OR  Informed Consent: I have reviewed the patients History and Physical, chart, labs and discussed the procedure including the risks, benefits and alternatives for the proposed anesthesia with the patient or authorized representative who has indicated his/her understanding and acceptance.   Dental advisory given  Plan Discussed with: CRNA  Anesthesia Plan Comments: (Possible LMA if GERD well controlled.   Order EKG)      Anesthesia Quick Evaluation

## 2015-05-07 NOTE — Anesthesia Procedure Notes (Signed)
Procedure Name: LMA Insertion Date/Time: 05/07/2015 8:56 AM Performed by: Marylyn IshiharaFURLOW, Tsuyako Jolley Pre-anesthesia Checklist: Patient identified, Timeout performed, Emergency Drugs available, Suction available and Patient being monitored Patient Re-evaluated:Patient Re-evaluated prior to inductionOxygen Delivery Method: Circle system utilized Preoxygenation: Pre-oxygenation with 100% oxygen Intubation Type: IV induction Ventilation: Mask ventilation without difficulty LMA: LMA inserted LMA Size: 4.0 Number of attempts: 1 Placement Confirmation: positive ETCO2 and breath sounds checked- equal and bilateral Tube secured with: Tape Dental Injury: Teeth and Oropharynx as per pre-operative assessment

## 2015-05-07 NOTE — Discharge Instructions (Signed)
The patient may remove the eye patch today at 1:00 PM she should wear her eyeglasses or sunglasses at all times. Sleep with the plastic eye she'll over her eye do not sleep on the left side the patient should sleep on her back or her right side. Avoid heavy lifting bending or straining. The patient may take Tylenol or her regular pain medication as needed for pain. If pain is not relieved the patient should call the doctor's office.

## 2015-05-08 ENCOUNTER — Encounter (HOSPITAL_COMMUNITY): Payer: Self-pay | Admitting: Ophthalmology

## 2015-08-07 DIAGNOSIS — H401133 Primary open-angle glaucoma, bilateral, severe stage: Secondary | ICD-10-CM | POA: Diagnosis not present

## 2015-08-23 NOTE — Progress Notes (Signed)
REVIEWED-NO ADDITIONAL RECOMMENDATIONS. 

## 2015-09-15 ENCOUNTER — Encounter (INDEPENDENT_AMBULATORY_CARE_PROVIDER_SITE_OTHER): Payer: Medicare Other | Admitting: Ophthalmology

## 2015-09-15 DIAGNOSIS — H43813 Vitreous degeneration, bilateral: Secondary | ICD-10-CM

## 2015-09-15 DIAGNOSIS — H53413 Scotoma involving central area, bilateral: Secondary | ICD-10-CM

## 2015-09-15 DIAGNOSIS — H59032 Cystoid macular edema following cataract surgery, left eye: Secondary | ICD-10-CM | POA: Diagnosis not present

## 2015-10-09 DIAGNOSIS — H401133 Primary open-angle glaucoma, bilateral, severe stage: Secondary | ICD-10-CM | POA: Diagnosis not present

## 2015-10-09 DIAGNOSIS — H04123 Dry eye syndrome of bilateral lacrimal glands: Secondary | ICD-10-CM | POA: Diagnosis not present

## 2015-10-27 ENCOUNTER — Encounter (INDEPENDENT_AMBULATORY_CARE_PROVIDER_SITE_OTHER): Payer: Medicare Other | Admitting: Ophthalmology

## 2015-11-03 ENCOUNTER — Encounter (INDEPENDENT_AMBULATORY_CARE_PROVIDER_SITE_OTHER): Payer: Medicare Other | Admitting: Ophthalmology

## 2015-11-10 DIAGNOSIS — Z961 Presence of intraocular lens: Secondary | ICD-10-CM | POA: Diagnosis not present

## 2015-11-10 DIAGNOSIS — H401131 Primary open-angle glaucoma, bilateral, mild stage: Secondary | ICD-10-CM | POA: Diagnosis not present

## 2015-11-27 ENCOUNTER — Encounter (INDEPENDENT_AMBULATORY_CARE_PROVIDER_SITE_OTHER): Payer: Medicare Other | Admitting: Ophthalmology

## 2015-11-27 DIAGNOSIS — H59032 Cystoid macular edema following cataract surgery, left eye: Secondary | ICD-10-CM | POA: Diagnosis not present

## 2015-11-27 DIAGNOSIS — H43813 Vitreous degeneration, bilateral: Secondary | ICD-10-CM | POA: Diagnosis not present

## 2015-12-17 ENCOUNTER — Encounter: Payer: Self-pay | Admitting: Internal Medicine

## 2015-12-29 ENCOUNTER — Encounter (HOSPITAL_COMMUNITY): Payer: Self-pay | Admitting: *Deleted

## 2015-12-29 ENCOUNTER — Emergency Department (HOSPITAL_COMMUNITY): Payer: Medicare Other

## 2015-12-29 ENCOUNTER — Emergency Department (HOSPITAL_COMMUNITY)
Admission: EM | Admit: 2015-12-29 | Discharge: 2015-12-29 | Disposition: A | Discharge status: Home / Self Care | Payer: Medicare Other | Attending: Emergency Medicine | Admitting: Emergency Medicine

## 2015-12-29 DIAGNOSIS — S5291XA Unspecified fracture of right forearm, initial encounter for closed fracture: Secondary | ICD-10-CM

## 2015-12-29 DIAGNOSIS — Y929 Unspecified place or not applicable: Secondary | ICD-10-CM | POA: Insufficient documentation

## 2015-12-29 DIAGNOSIS — Y939 Activity, unspecified: Secondary | ICD-10-CM | POA: Diagnosis not present

## 2015-12-29 DIAGNOSIS — S52501A Unspecified fracture of the lower end of right radius, initial encounter for closed fracture: Secondary | ICD-10-CM | POA: Insufficient documentation

## 2015-12-29 DIAGNOSIS — W010XXA Fall on same level from slipping, tripping and stumbling without subsequent striking against object, initial encounter: Secondary | ICD-10-CM | POA: Diagnosis not present

## 2015-12-29 DIAGNOSIS — W19XXXA Unspecified fall, initial encounter: Secondary | ICD-10-CM

## 2015-12-29 DIAGNOSIS — Z79899 Other long term (current) drug therapy: Secondary | ICD-10-CM | POA: Diagnosis not present

## 2015-12-29 DIAGNOSIS — M1990 Unspecified osteoarthritis, unspecified site: Secondary | ICD-10-CM | POA: Insufficient documentation

## 2015-12-29 DIAGNOSIS — Y999 Unspecified external cause status: Secondary | ICD-10-CM | POA: Insufficient documentation

## 2015-12-29 DIAGNOSIS — M25531 Pain in right wrist: Secondary | ICD-10-CM | POA: Diagnosis present

## 2015-12-29 DIAGNOSIS — H409 Unspecified glaucoma: Secondary | ICD-10-CM | POA: Diagnosis not present

## 2015-12-29 DIAGNOSIS — Z7982 Long term (current) use of aspirin: Secondary | ICD-10-CM | POA: Diagnosis not present

## 2015-12-29 DIAGNOSIS — S52592A Other fractures of lower end of left radius, initial encounter for closed fracture: Secondary | ICD-10-CM | POA: Diagnosis not present

## 2015-12-29 MED ORDER — OXYCODONE-ACETAMINOPHEN 5-325 MG PO TABS: 1.0000 | ORAL_TABLET | ORAL | Status: AC | PRN

## 2015-12-29 NOTE — ED Notes (Signed)
Pt tripped over steps causing her to fall forward landing on her right wrist and face. Pt denies any loss of consciousness any pt denies any blood thinner use. Pt is alert and oriented upon triage.

## 2015-12-29 NOTE — Discharge Instructions (Signed)
You have fractured your right radius bone. Follow-up with Dr. Romeo AppleHarrison tomorrow morning. Arrival at 9:30 AM. They will work you into the schedule. Elevate. Ice. Pain medication. Splint.

## 2015-12-29 NOTE — ED Provider Notes (Signed)
History  By signing my name below, I, Earmon Phoenix, attest that this documentation has been prepared under the direction and in the presence of Donnetta Hutching, MD. Electronically Signed: Earmon Phoenix, ED Scribe. 12/29/2015. 1:01 PM.  Chief Complaint  Patient presents with  . Fall   The history is provided by the patient and medical records. No language interpreter was used.    HPI Comments:  Mikayla [name] is a 75 y.o. female who presents to the Emergency Department complaining of a mechanical fall that occurred about 2.5 hours ago. Pt states she missed a step and fell forward landing on her right wrist and face. She reports an associated laceration below her nose. She has not taken anything for pain but has applied ice to the area. She denies modifying factors. She denies LOC, nausea, vomiting. She reports fracturing the right wrist 60 years ago. PCP is Dr. Sherril Croon.  Past Medical History  Diagnosis Date  . Hiatal hernia     small  . Internal hemorrhoids   . Glaucoma   . Pneumonia 1986  . History of bronchitis 1996  . History of migraine     NOTHING IN 41YRS   . Arthritis     MILD  . Chronic back pain     GOES TO CHIROPRACTOR EVERY OTHER MONTH  . GERD (gastroesophageal reflux disease)     TAKES ZEGERID DAILY   . IBS (irritable bowel syndrome)    Past Surgical History  Procedure Laterality Date  . Right oophorectomy    . Left oophorectomy      partial  . Appendectomy    . Esophagogastroduodenoscopy  01/08/2005    Dr. Jena Gauss- multiple distal esophageal erosions consistent with erosive reflux esophagitis, small hiatal hernia o/w normal stomach  . Colonoscopy  03/06/2004    Dr. Jena Gauss- internal hemorrhoids, o/w normal colon.  . Colonoscopy N/A 03/04/2014    Procedure: COLONOSCOPY;  Surgeon: Corbin Ade, MD;  Location: [facility];  Service: Endoscopy;  Laterality: N/A;  730  . Abdominal hysterectomy  1981  . Tonsillectomy      AS A CHILD  . Elbow/wrist surgery Right AT AGE  44  . Cataract extraction w/phaco Left 05/07/2015    Procedure: CATARACT EXTRACTION PHACO AND INTRAOCULAR LENS PLACEMENT (IOC) WITH MINI TUBE, PLACEMENT OF MITOSOL;  Surgeon: Chalmers Guest, MD;  Location: [facility] OR;  Service: Ophthalmology;  Laterality: Left;   Family History  Problem Relation Age of Onset  . Colon cancer Neg Hx    Social History  Substance Use Topics  . Smoking status: Never Smoker   . Smokeless tobacco: Never Used  . Alcohol Use: No   OB History    No data available     Review of Systems A complete 10 system review of systems was obtained and all systems are negative except as noted in the HPI and PMH.   Allergies  Erythromycin; Penicillins; Carrot; Celery oil; Gluten meal; Lactose intolerance (gi); and Latex  Home Medications   Prior to Admission medications   Medication Sig Start Date End Date Taking? Authorizing Provider  Ascorbic Acid (VITAMIN C) 1000 MG tablet Take 1,000 mg by mouth 2 (two) times daily.    Yes Historical Provider, MD  aspirin EC 81 MG tablet Take 81 mg by mouth daily.   Yes Historical Provider, MD  Aspirin-Caffeine (BAYER BACK & BODY PO) Take 1 tablet by mouth 2 (two) times daily as needed (pain).   Yes Historical Provider, MD  b complex  vitamins tablet Take 1 tablet by mouth daily.   Yes Historical Provider, MD  bimatoprost (LUMIGAN) 0.01 % SOLN Place 1 drop into both eyes at bedtime.   Yes Historical Provider, MD  Carboxymethylcellul-Glycerin (REFRESH OPTIVE OP) Place 1 drop into both eyes 3 (three) times daily as needed (dry eyes).    Yes Historical Provider, MD  cycloSPORINE (RESTASIS) 0.05 % ophthalmic emulsion Place 1 drop into both eyes every 12 (twelve) hours.    Yes Historical Provider, MD  dorzolamide (TRUSOPT) 2 % ophthalmic solution Place 1 drop into both eyes 3 (three) times daily. 04/15/15  Yes Historical Provider, MD  Lutein 40 MG CAPS Take 40 mg by mouth daily with lunch.   Yes Historical Provider, MD  Multiple Vitamin  (MULTIVITAMIN WITH MINERALS) TABS tablet Take 1 tablet by mouth daily. One a Day Women's   Yes Historical Provider, MD  Multiple Vitamins-Minerals (ICAPS) CAPS Take 1 capsule by mouth 2 (two) times daily.    Yes Historical Provider, MD  Omega-3 Fatty Acids (OMEGA-3 PLUS PO) Take 1 capsule by mouth daily.   Yes Historical Provider, MD  OVER THE COUNTER MEDICATION Take 1 tablet by mouth daily as needed (allergies). Flora Sinus   Yes Historical Provider, MD  Polyethyl Glycol-Propyl Glycol (SYSTANE ULTRA OP) Place 1 drop into both eyes 3 (three) times daily as needed (dry eyes).   Yes Historical Provider, MD  Probiotic Product (ALIGN) 4 MG CAPS Take 4 mg by mouth every morning.    Yes Historical Provider, MD  Probiotic Product (PROBIOTIC PO) Take 1 tablet by mouth every evening. Ultra Flora   Yes Historical Provider, MD  vitamin B-12 (CYANOCOBALAMIN) 500 MCG tablet Take 500 mcg by mouth daily.   Yes Historical Provider, MD  oxyCODONE-acetaminophen (PERCOCET) 5-325 MG tablet Take 1 tablet by mouth every 4 (four) hours as needed. 12/29/15   Donnetta HutchingBrian Sharaine Delange, MD   Triage Vitals: BP 145/70 mmHg  Pulse 71  Temp(Src) 97.9 F (36.6 C) (Oral)  Resp 17  Ht 5\' 4"  (1.626 m)  Wt 165 lb (74.844 kg)  BMI 28.31 kg/m2  SpO2 99% Physical Exam  Constitutional: She is oriented to person, place, and time. She appears well-developed and well-nourished.  HENT:  Head: Normocephalic.  1.5 cm oblique superficial laceration to left philthum area  Eyes: Conjunctivae and EOM are normal. Pupils are equal, round, and reactive to light.  Neck: Normal range of motion. Neck supple.  Cardiovascular: Normal rate and regular rhythm.   Pulmonary/Chest: Effort normal and breath sounds normal.  Abdominal: Soft. Bowel sounds are normal.  Musculoskeletal: She exhibits tenderness.  Tenderness to distal right radius  Neurological: She is alert and oriented to person, place, and time.  Skin: Skin is warm and dry.  Psychiatric: She has a  normal mood and affect. Her behavior is normal.  Nursing note and vitals reviewed.   ED Course  Procedures (including critical care time) DIAGNOSTIC STUDIES: Oxygen Saturation is 99% on RA, normal by my interpretation.   COORDINATION OF CARE: 12:59 PM- Will consult orthopedics and give referral. Will order splint. Pt verbalizes understanding and agrees to plan.  Medications - No data to display  Labs Review Labs Reviewed - No data to display  Imaging Review Dg Wrist Complete Right  12/29/2015  CLINICAL DATA:  Fall today.  Right wrist pain and swelling. EXAM: RIGHT WRIST - COMPLETE 3+ VIEW COMPARISON:  None. FINDINGS: There is an impacted, displaced intra-articular distal right radial fracture. Mild posterior angulation. Ulnar styloid fracture  also noted. IMPRESSION: Mildly impacted, displaced and angulated distal right radial fracture. Ulnar styloid fracture. Electronically Signed   By: Charlett Nose M.D.   On: 12/29/2015 12:11   I have personally reviewed and evaluated these images and lab results as part of my medical decision-making.   EKG Interpretation None      MDM   Final diagnoses:  Fall, initial encounter  Right radial fracture, closed, initial encounter    No head or neck trauma. Plain films of right wrist show a distal impacted displaced angulated right radial fracture. Splint, ice, elevate, pain meds, discussed with orthopedic surgeon. Referral to same.  I personally performed the services described in this documentation, which was scribed in my presence. The recorded information has been reviewed and is accurate.      Donnetta Hutching, MD 12/29/15 1436

## 2015-12-30 ENCOUNTER — Ambulatory Visit (INDEPENDENT_AMBULATORY_CARE_PROVIDER_SITE_OTHER): Payer: Medicare Other | Admitting: Orthopedic Surgery

## 2015-12-30 VITALS — BP 130/70 | HR 83 | Ht 65.0 in | Wt 182.0 lb

## 2015-12-30 DIAGNOSIS — S52531A Colles' fracture of right radius, initial encounter for closed fracture: Secondary | ICD-10-CM

## 2015-12-30 NOTE — Progress Notes (Signed)
EMERGENCY ROOM FOLLOW UP  NEW PROBLEM/PATIENT   Patient ID: Mikayla [name], female   DOB: May 14, 1941, 75 y.o.   MRN: [id number]  Emergency room record from (date) 12/29/2015 has been reviewed and this is included by reference and includes the review of systems with the following addition:   Chief Complaint  Patient presents with  . Follow-up    ER Follow up Right wrist DOI 12/29/15    HPI Mikayla [name] is a 75 y.o. female.  75 year old female missed a step injured her right wrist. Went to the ER for x-rays and was splinted. Location right wrist quality dull throbbing severity moderate duration one day timing constant contact secondary to fall modifying factors splinting has relieved some of the discomfort  Review of Systems Review of Systems  Constitutional: Negative for fever.  Neurological: Negative for numbness.    Past Medical History  Diagnosis Date  . Hiatal hernia     small  . Internal hemorrhoids   . Glaucoma   . Pneumonia 1986  . History of bronchitis 1996  . History of migraine     NOTHING IN 102YRS   . Arthritis     MILD  . Chronic back pain     GOES TO CHIROPRACTOR EVERY OTHER MONTH  . GERD (gastroesophageal reflux disease)     TAKES ZEGERID DAILY   . IBS (irritable bowel syndrome)     Past Surgical History  Procedure Laterality Date  . Right oophorectomy    . Left oophorectomy      partial  . Appendectomy    . Esophagogastroduodenoscopy  01/08/2005    Dr. Jena Gauss- multiple distal esophageal erosions consistent with erosive reflux esophagitis, small hiatal hernia o/w normal stomach  . Colonoscopy  03/06/2004    Dr. Jena Gauss- internal hemorrhoids, o/w normal colon.  . Colonoscopy N/A 03/04/2014    Procedure: COLONOSCOPY;  Surgeon: Corbin Ade, MD;  Location: [facility];  Service: Endoscopy;  Laterality: N/A;  730  . Abdominal hysterectomy  1981  . Tonsillectomy      AS A CHILD  . Elbow/wrist surgery Right AT AGE 34  . Cataract extraction w/phaco Left  05/07/2015    Procedure: CATARACT EXTRACTION PHACO AND INTRAOCULAR LENS PLACEMENT (IOC) WITH MINI TUBE, PLACEMENT OF MITOSOL;  Surgeon: Chalmers Guest, MD;  Location: [facility] OR;  Service: Ophthalmology;  Laterality: Left;    Family History  Problem Relation Age of Onset  . Colon cancer Neg Hx     Social History Social History  Substance Use Topics  . Smoking status: Never Smoker   . Smokeless tobacco: Never Used  . Alcohol Use: No    Allergies  Allergen Reactions  . Erythromycin Swelling    Tongue and lips swelling, redness around mouth and chin  . Penicillins Anaphylaxis and Swelling    Mouth swells Has patient had a PCN reaction causing immediate rash, facial/tongue/throat swelling, SOB or lightheadedness with hypotension: Yes Has patient had a PCN reaction causing severe rash involving mucus membranes or skin necrosis: No Has patient had a PCN reaction that required hospitalization Yes Has patient had a PCN reaction occurring within the last 10 years: No If all of the above answers are "NO", then may proceed with Cephalosporin use.  . Carrot [Daucus Carota] Other (See Comments)    Cooked carrots causes abdominal pain  . Celery Oil Other (See Comments)    Cooked celery causes abdominal pain  . Gluten Meal Other (See Comments)    Abdominal pain  .  Lactose Intolerance (Gi) Other (See Comments)    Per allergy test pt does not eat any dairy  . Latex Other (See Comments)    Dentist's gloves make inside of mouth raw    Current Outpatient Prescriptions  Medication Sig Dispense Refill  . Ascorbic Acid (VITAMIN C) 1000 MG tablet Take 1,000 mg by mouth 2 (two) times daily.     Marland Kitchen. aspirin EC 81 MG tablet Take 81 mg by mouth daily.    . Aspirin-Caffeine (BAYER BACK & BODY PO) Take 1 tablet by mouth 2 (two) times daily as needed (pain).    Marland Kitchen. b complex vitamins tablet Take 1 tablet by mouth daily.    . bimatoprost (LUMIGAN) 0.01 % SOLN Place 1 drop into both eyes at bedtime.    .  Carboxymethylcellul-Glycerin (REFRESH OPTIVE OP) Place 1 drop into both eyes 3 (three) times daily as needed (dry eyes).     . cycloSPORINE (RESTASIS) 0.05 % ophthalmic emulsion Place 1 drop into both eyes every 12 (twelve) hours.     . dorzolamide (TRUSOPT) 2 % ophthalmic solution Place 1 drop into both eyes 3 (three) times daily.    . Lutein 40 MG CAPS Take 40 mg by mouth daily with lunch.    . Multiple Vitamin (MULTIVITAMIN WITH MINERALS) TABS tablet Take 1 tablet by mouth daily. One a Day Women's    . Multiple Vitamins-Minerals (ICAPS) CAPS Take 1 capsule by mouth 2 (two) times daily.     . Omega-3 Fatty Acids (OMEGA-3 PLUS PO) Take 1 capsule by mouth daily.    Marland Kitchen. OVER THE COUNTER MEDICATION Take 1 tablet by mouth daily as needed (allergies). Flora Sinus    . oxyCODONE-acetaminophen (PERCOCET) 5-325 MG tablet Take 1 tablet by mouth every 4 (four) hours as needed. 20 tablet 0  . Polyethyl Glycol-Propyl Glycol (SYSTANE ULTRA OP) Place 1 drop into both eyes 3 (three) times daily as needed (dry eyes).    . Probiotic Product (ALIGN) 4 MG CAPS Take 4 mg by mouth every morning.     . Probiotic Product (PROBIOTIC PO) Take 1 tablet by mouth every evening. Ultra Flora    . vitamin B-12 (CYANOCOBALAMIN) 500 MCG tablet Take 500 mcg by mouth daily.     No current facility-administered medications for this visit.    Physical Exam BP 130/70 mmHg  Pulse 83  Ht 5\' 5"  (1.651 m)  Wt 182 lb (82.555 kg)  BMI 30.29 kg/m2  Physical Exam  Constitutional: She is oriented to person, place, and time. She appears well-developed and well-nourished. No distress.  Cardiovascular: Normal rate and intact distal pulses.   Neurological: She is alert and oriented to person, place, and time. She has normal reflexes. She exhibits normal muscle tone. Coordination normal.  Skin: Skin is warm and dry. No rash noted. She is not diaphoretic. No erythema. No pallor.  Psychiatric: She has a normal mood and affect. Her behavior  is normal. Judgment and thought content normal.   Ambulatory status  normal with no assistive devices  Right and left leg nontender hip femur and tibia.  Right and left arm, close nontender humeri nontender left forearm wrist and hand nontender  Right elbow nontender  Right wrist splinted. Fingers have good capillary refill and color normal temperature and normal flexion at the IP joints  Data Reviewed IMAGING FORM THE ER AND THE REPORT ARE REVIEWED, MY INTERPRETATION OF THE IMAGE(S) IS : Intra-articular 3 part fracture distal radius with dorsal tilt of the distal  fragment apex volar angulation. Radial styloid fragment is displaced as is the ulnar dorsal fragment of the lunate fossa and there is significant shortening and loss of radial inclination  Assessment  CloseD intra-articular 3 part distal radius fracture right wrist   Plan  REFERRAL TO DR ZOXWR PATIENT REQUEST   Fuller Canada, MD 12/30/2015 10:18 AM

## 2015-12-30 NOTE — Addendum Note (Signed)
Addended by: Debby BudLONG, Libia Fazzini R on: 12/30/2015 05:06 PM   Modules accepted: Orders

## 2015-12-31 ENCOUNTER — Other Ambulatory Visit: Payer: Self-pay | Admitting: Orthopedic Surgery

## 2015-12-31 ENCOUNTER — Encounter (HOSPITAL_BASED_OUTPATIENT_CLINIC_OR_DEPARTMENT_OTHER): Payer: Self-pay | Admitting: *Deleted

## 2015-12-31 DIAGNOSIS — S52571A Other intraarticular fracture of lower end of right radius, initial encounter for closed fracture: Secondary | ICD-10-CM | POA: Diagnosis not present

## 2016-01-01 ENCOUNTER — Ambulatory Visit (HOSPITAL_BASED_OUTPATIENT_CLINIC_OR_DEPARTMENT_OTHER): Payer: Medicare Other | Admitting: Anesthesiology

## 2016-01-01 ENCOUNTER — Encounter (HOSPITAL_BASED_OUTPATIENT_CLINIC_OR_DEPARTMENT_OTHER)
Admission: RE | Disposition: A | Discharge status: Home / Self Care | Payer: Self-pay | Source: Ambulatory Visit | Attending: Orthopedic Surgery

## 2016-01-01 ENCOUNTER — Encounter (HOSPITAL_BASED_OUTPATIENT_CLINIC_OR_DEPARTMENT_OTHER): Payer: Self-pay | Admitting: Anesthesiology

## 2016-01-01 ENCOUNTER — Ambulatory Visit (HOSPITAL_BASED_OUTPATIENT_CLINIC_OR_DEPARTMENT_OTHER)
Admission: RE | Admit: 2016-01-01 | Discharge: 2016-01-01 | Disposition: A | Discharge status: Home / Self Care | Payer: Medicare Other | Source: Ambulatory Visit | Attending: Orthopedic Surgery | Admitting: Orthopedic Surgery

## 2016-01-01 DIAGNOSIS — K219 Gastro-esophageal reflux disease without esophagitis: Secondary | ICD-10-CM | POA: Diagnosis not present

## 2016-01-01 DIAGNOSIS — G8918 Other acute postprocedural pain: Secondary | ICD-10-CM | POA: Diagnosis not present

## 2016-01-01 DIAGNOSIS — S52571A Other intraarticular fracture of lower end of right radius, initial encounter for closed fracture: Secondary | ICD-10-CM | POA: Diagnosis not present

## 2016-01-01 DIAGNOSIS — W010XXA Fall on same level from slipping, tripping and stumbling without subsequent striking against object, initial encounter: Secondary | ICD-10-CM | POA: Insufficient documentation

## 2016-01-01 DIAGNOSIS — S52501A Unspecified fracture of the lower end of right radius, initial encounter for closed fracture: Secondary | ICD-10-CM | POA: Diagnosis not present

## 2016-01-01 DIAGNOSIS — M79631 Pain in right forearm: Secondary | ICD-10-CM | POA: Diagnosis not present

## 2016-01-01 HISTORY — DX: Other complications of anesthesia, initial encounter: T88.59XA

## 2016-01-01 HISTORY — DX: Nausea with vomiting, unspecified: Z98.890

## 2016-01-01 HISTORY — DX: Nausea with vomiting, unspecified: R11.2

## 2016-01-01 HISTORY — PX: OPEN REDUCTION INTERNAL FIXATION (ORIF) DISTAL RADIAL FRACTURE: SHX5989

## 2016-01-01 HISTORY — DX: Adverse effect of unspecified anesthetic, initial encounter: T41.45XA

## 2016-01-01 SURGERY — OPEN REDUCTION INTERNAL FIXATION (ORIF) DISTAL RADIUS FRACTURE
Anesthesia: Regional | Site: Hand | Laterality: Right

## 2016-01-01 MED ORDER — LIDOCAINE HCL (CARDIAC) 20 MG/ML IV SOLN
INTRAVENOUS | Status: DC | PRN
  Administered 2016-01-01: 30 mg via INTRAVENOUS

## 2016-01-01 MED ORDER — FENTANYL CITRATE (PF) 100 MCG/2ML IJ SOLN
INTRAMUSCULAR | Status: AC
  Filled 2016-01-01: qty 2

## 2016-01-01 MED ORDER — FENTANYL CITRATE (PF) 100 MCG/2ML IJ SOLN
50.0000 ug | INTRAMUSCULAR | Status: DC | PRN
  Administered 2016-01-01: 50 ug via INTRAVENOUS

## 2016-01-01 MED ORDER — DEXAMETHASONE SODIUM PHOSPHATE 4 MG/ML IJ SOLN
INTRAMUSCULAR | Status: DC | PRN
  Administered 2016-01-01: 10 mg via INTRAVENOUS

## 2016-01-01 MED ORDER — BUPIVACAINE-EPINEPHRINE (PF) 0.5% -1:200000 IJ SOLN
INTRAMUSCULAR | Status: DC | PRN
  Administered 2016-01-01: 25 mL via PERINEURAL

## 2016-01-01 MED ORDER — LIDOCAINE 2% (20 MG/ML) 5 ML SYRINGE
INTRAMUSCULAR | Status: AC
  Filled 2016-01-01: qty 5

## 2016-01-01 MED ORDER — CHLORHEXIDINE GLUCONATE 4 % EX LIQD: 60.0000 mL | Freq: Once | CUTANEOUS | Status: DC

## 2016-01-01 MED ORDER — GLYCOPYRROLATE 0.2 MG/ML IJ SOLN: 0.2000 mg | Freq: Once | INTRAMUSCULAR | Status: DC | PRN

## 2016-01-01 MED ORDER — FENTANYL CITRATE (PF) 100 MCG/2ML IJ SOLN
INTRAMUSCULAR | Status: DC | PRN
  Administered 2016-01-01: 100 ug via INTRAVENOUS

## 2016-01-01 MED ORDER — PHENYLEPHRINE HCL 10 MG/ML IJ SOLN
INTRAMUSCULAR | Status: DC | PRN
  Administered 2016-01-01 (×3): 80 ug via INTRAVENOUS

## 2016-01-01 MED ORDER — SCOPOLAMINE 1 MG/3DAYS TD PT72: 1.0000 | MEDICATED_PATCH | Freq: Once | TRANSDERMAL | Status: DC | PRN

## 2016-01-01 MED ORDER — ONDANSETRON HCL 4 MG/2ML IJ SOLN
INTRAMUSCULAR | Status: DC | PRN
  Administered 2016-01-01: 4 mg via INTRAVENOUS

## 2016-01-01 MED ORDER — PROPOFOL 10 MG/ML IV BOLUS
INTRAVENOUS | Status: AC
  Filled 2016-01-01: qty 20

## 2016-01-01 MED ORDER — PROPOFOL 10 MG/ML IV BOLUS
INTRAVENOUS | Status: DC | PRN
  Administered 2016-01-01: 150 mg via INTRAVENOUS
  Administered 2016-01-01: 50 mg via INTRAVENOUS

## 2016-01-01 MED ORDER — OXYCODONE-ACETAMINOPHEN 7.5-325 MG PO TABS: 1.0000 | ORAL_TABLET | ORAL | Status: AC | PRN

## 2016-01-01 MED ORDER — MIDAZOLAM HCL 2 MG/2ML IJ SOLN
1.0000 mg | INTRAMUSCULAR | Status: DC | PRN
  Administered 2016-01-01: 1 mg via INTRAVENOUS

## 2016-01-01 MED ORDER — LACTATED RINGERS IV SOLN
INTRAVENOUS | Status: DC
  Administered 2016-01-01: 14:00:00 via INTRAVENOUS
  Administered 2016-01-01: 10 mL/h via INTRAVENOUS

## 2016-01-01 MED ORDER — DEXAMETHASONE SODIUM PHOSPHATE 10 MG/ML IJ SOLN
INTRAMUSCULAR | Status: AC
  Filled 2016-01-01: qty 1

## 2016-01-01 MED ORDER — ONDANSETRON HCL 4 MG/2ML IJ SOLN
INTRAMUSCULAR | Status: AC
  Filled 2016-01-01: qty 2

## 2016-01-01 MED ORDER — VANCOMYCIN HCL IN DEXTROSE 1-5 GM/200ML-% IV SOLN
1000.0000 mg | INTRAVENOUS | Status: AC
  Administered 2016-01-01: 1000 mg via INTRAVENOUS

## 2016-01-01 MED ORDER — MIDAZOLAM HCL 2 MG/2ML IJ SOLN
INTRAMUSCULAR | Status: AC
  Filled 2016-01-01: qty 2

## 2016-01-01 SURGICAL SUPPLY — 64 items
BIT DRILL 2.0 LNG QUCK RELEASE (BIT) ×1 IMPLANT
BIT DRILL 2.8X5 QR DISP (BIT) ×3 IMPLANT
BLADE MINI RND TIP GREEN BEAV (BLADE) IMPLANT
BLADE SURG 15 STRL LF DISP TIS (BLADE) ×2 IMPLANT
BLADE SURG 15 STRL SS (BLADE) ×4
BNDG CMPR 9X4 STRL LF SNTH (GAUZE/BANDAGES/DRESSINGS) ×2
BNDG COHESIVE 3X5 TAN STRL LF (GAUZE/BANDAGES/DRESSINGS) ×4 IMPLANT
BNDG COHESIVE 4X5 TAN STRL (GAUZE/BANDAGES/DRESSINGS) ×3 IMPLANT
BNDG ESMARK 4X9 LF (GAUZE/BANDAGES/DRESSINGS) ×4 IMPLANT
BNDG GAUZE ELAST 4 BULKY (GAUZE/BANDAGES/DRESSINGS) ×4 IMPLANT
CHLORAPREP W/TINT 26ML (MISCELLANEOUS) ×4 IMPLANT
CORDS BIPOLAR (ELECTRODE) ×4 IMPLANT
COVER BACK TABLE 60X90IN (DRAPES) ×4 IMPLANT
COVER MAYO STAND STRL (DRAPES) ×4 IMPLANT
CUFF TOURNIQUET SINGLE 18IN (TOURNIQUET CUFF) ×3 IMPLANT
DECANTER SPIKE VIAL GLASS SM (MISCELLANEOUS) IMPLANT
DRAPE EXTREMITY T 121X128X90 (DRAPE) ×4 IMPLANT
DRAPE OEC MINIVIEW 54X84 (DRAPES) ×4 IMPLANT
DRAPE SURG 17X23 STRL (DRAPES) ×4 IMPLANT
DRILL 2.0 LNG QUICK RELEASE (BIT) ×4
DRSG PAD ABDOMINAL 8X10 ST (GAUZE/BANDAGES/DRESSINGS) ×3 IMPLANT
GAUZE SPONGE 4X4 12PLY STRL (GAUZE/BANDAGES/DRESSINGS) ×4 IMPLANT
GAUZE XEROFORM 1X8 LF (GAUZE/BANDAGES/DRESSINGS) ×4 IMPLANT
GLOVE BIOGEL PI IND STRL 7.0 (GLOVE) ×1 IMPLANT
GLOVE BIOGEL PI IND STRL 8 (GLOVE) ×1 IMPLANT
GLOVE BIOGEL PI IND STRL 8.5 (GLOVE) ×2 IMPLANT
GLOVE BIOGEL PI INDICATOR 7.0 (GLOVE) ×2
GLOVE BIOGEL PI INDICATOR 8 (GLOVE) ×2
GLOVE BIOGEL PI INDICATOR 8.5 (GLOVE) ×2
GLOVE SURG ORTHO 8.0 STRL STRW (GLOVE) ×4 IMPLANT
GLOVE SURG SS PI 8.0 STRL IVOR (GLOVE) ×12 IMPLANT
GOWN STRL REUS W/ TWL LRG LVL3 (GOWN DISPOSABLE) ×2 IMPLANT
GOWN STRL REUS W/TWL LRG LVL3 (GOWN DISPOSABLE) ×4
GOWN STRL REUS W/TWL XL LVL3 (GOWN DISPOSABLE) ×4 IMPLANT
GUIDEWIRE ORTHO 0.054X6 (WIRE) ×12 IMPLANT
NDL PRECISIONGLIDE 27X1.5 (NEEDLE) IMPLANT
NEEDLE PRECISIONGLIDE 27X1.5 (NEEDLE) ×4 IMPLANT
NS IRRIG 1000ML POUR BTL (IV SOLUTION) ×4 IMPLANT
PACK BASIN DAY SURGERY FS (CUSTOM PROCEDURE TRAY) ×4 IMPLANT
PAD CAST 3X4 CTTN HI CHSV (CAST SUPPLIES) ×2 IMPLANT
PADDING CAST ABS 4INX4YD NS (CAST SUPPLIES) ×2
PADDING CAST ABS COTTON 4X4 ST (CAST SUPPLIES) ×1 IMPLANT
PADDING CAST COTTON 3X4 STRL (CAST SUPPLIES) ×4
PLATE ACULOCK 2 NARROW RT (Plate) ×3 IMPLANT
SCREW CORT FT 18X2.3XLCK HEX (Screw) ×1 IMPLANT
SCREW CORT FT 20X2.3XLCK HEX (Screw) ×1 IMPLANT
SCREW CORTICAL LOCKING 2.3X18M (Screw) ×8 IMPLANT
SCREW CORTICAL LOCKING 2.3X20M (Screw) ×8 IMPLANT
SCREW CORTICAL LOCKING 2.3X22M (Screw) ×8 IMPLANT
SCREW FX18X2.3XSMTH LCK NS CRT (Screw) ×1 IMPLANT
SCREW FX20X2.3XSMTH LCK NS CRT (Screw) ×1 IMPLANT
SCREW FX22X2.3XLCK SMTH NS CRT (Screw) ×2 IMPLANT
SCREW NONLOCK HEX 3.5X12 (Screw) ×6 IMPLANT
SLEEVE SCD COMPRESS KNEE MED (MISCELLANEOUS) ×4 IMPLANT
SPLINT PLASTER CAST XFAST 3X15 (CAST SUPPLIES) IMPLANT
SPLINT PLASTER XTRA FASTSET 3X (CAST SUPPLIES)
SPONGE GAUZE 4X4 12PLY STER LF (GAUZE/BANDAGES/DRESSINGS) ×3 IMPLANT
STOCKINETTE 4X48 STRL (DRAPES) ×4 IMPLANT
SUT ETHILON 4 0 PS 2 18 (SUTURE) ×7 IMPLANT
SUT VICRYL 4-0 PS2 18IN ABS (SUTURE) ×7 IMPLANT
SYR BULB 3OZ (MISCELLANEOUS) ×4 IMPLANT
SYR CONTROL 10ML LL (SYRINGE) ×3 IMPLANT
TOWEL OR 17X24 6PK STRL BLUE (TOWEL DISPOSABLE) ×4 IMPLANT
UNDERPAD 30X30 (UNDERPADS AND DIAPERS) ×4 IMPLANT

## 2016-01-01 NOTE — Brief Op Note (Signed)
01/01/2016  2:17 PM  PATIENT:  Elby Showersachel C Delashmit  75 y.o. female  PRE-OPERATIVE DIAGNOSIS:  RIGHT DISTAL RADIUS FRACTURE   POST-OPERATIVE DIAGNOSIS:  RIGHT DISTAL RADIUS FRACTURE  PROCEDURE:  Procedure(s): OPEN REDUCTION INTERNAL FIXATION (ORIF) RIGHT DISTAL RADIUS   (Right) POSSIBLE ALLOGRAFT   (Right)  SURGEON:  Surgeon(s) and Role:    * Cindee SaltGary Tache Bobst, MD - Primary    * Betha LoaKevin Marcin Holte, MD - Resident - Assisting  PHYSICIAN ASSISTANT:   ASSISTANTS: K Eevie Lapp,MD   ANESTHESIA:   regional and general  EBL:  Total I/O In: 1500 [I.V.:1500] Out: 3 [Blood:3]  BLOOD ADMINISTERED:none  DRAINS: none   LOCAL MEDICATIONS USED:  NONE  SPECIMEN:  No Specimen  DISPOSITION OF SPECIMEN:  N/A  COUNTS:  YES  TOURNIQUET:   Total Tourniquet Time Documented: Upper Arm (Right) - 56 minutes Total: Upper Arm (Right) - 56 minutes   DICTATION: .Other Dictation: Dictation Number [id number]  PLAN OF CARE: Discharge to home after PACU  PATIENT DISPOSITION:  PACU - hemodynamically stable.

## 2016-01-01 NOTE — Op Note (Signed)
I assisted Surgeon(s) and Role:    * Cindee SaltGary Quamere Mussell, MD - Primary    * Betha LoaKevin Arianni Gallego, MD - Resident - Assisting on the Procedure(s): OPEN REDUCTION INTERNAL FIXATION (ORIF) RIGHT DISTAL RADIUS   on 01/01/2016.  I provided assistance on this case as follows: retraction of soft tissues, reduction of fracture, placement of hardware.  Electronically signed by: Tami RibasKUZMA,Djuana Littleton R, MD Date: 01/01/2016 Time: 2:19 PM

## 2016-01-01 NOTE — Anesthesia Preprocedure Evaluation (Signed)
Anesthesia Evaluation  Patient identified by MRN, date of birth, ID band Patient awake    Reviewed: Allergy & Precautions, H&P , NPO status , Patient's Chart, lab work & pertinent test results  History of Anesthesia Complications (+) PONV  Airway Mallampati: II   Neck ROM: full    Dental   Pulmonary    breath sounds clear to auscultation       Cardiovascular negative cardio ROS   Rhythm:regular Rate:Normal     Neuro/Psych  Neuromuscular disease    GI/Hepatic hiatal hernia, GERD  ,  Endo/Other    Renal/GU      Musculoskeletal  (+) Arthritis ,   Abdominal   Peds  Hematology   Anesthesia Other Findings   Reproductive/Obstetrics                             Anesthesia Physical Anesthesia Plan  ASA: II  Anesthesia Plan: General and Regional   Post-op Pain Management:  Regional for Post-op pain   Induction: Intravenous  Airway Management Planned: LMA  Additional Equipment:   Intra-op Plan:   Post-operative Plan:   Informed Consent: I have reviewed the patients History and Physical, chart, labs and discussed the procedure including the risks, benefits and alternatives for the proposed anesthesia with the patient or authorized representative who has indicated his/her understanding and acceptance.     Plan Discussed with: CRNA, Anesthesiologist and Surgeon  Anesthesia Plan Comments:         Anesthesia Quick Evaluation

## 2016-01-01 NOTE — Progress Notes (Signed)
Assisted Dr. Hodierne with right, ultrasound guided, supraclavicular block. Side rails up, monitors on throughout procedure. See vital signs in flow sheet. Tolerated Procedure well.  

## 2016-01-01 NOTE — Anesthesia Procedure Notes (Addendum)
Anesthesia Regional Block:  Supraclavicular block  Pre-Anesthetic Checklist: ,, timeout performed, Correct Patient, Correct Site, Correct Laterality, Correct Procedure, Correct Position, site marked, Risks and benefits discussed,  Surgical consent,  Pre-op evaluation,  At surgeon's request and post-op pain management  Laterality: Right  Prep: chloraprep       Needles:  Injection technique: Single-shot  Needle Type: Echogenic Stimulator Needle     Needle Length: 5cm 5 cm Needle Gauge: 22 and 22 G    Additional Needles:  Procedures: ultrasound guided (picture in chart) and nerve stimulator Supraclavicular block  Nerve Stimulator or Paresthesia:  Response: biceps flexion, 0.45 mA,   Additional Responses:   Narrative:  Start time: 01/01/2016 12:20 PM End time: 01/01/2016 12:31 PM Injection made incrementally with aspirations every 5 mL.  Performed by: Personally  Anesthesiologist: HODIERNE, ADAM  Additional Notes: Functioning IV was confirmed and monitors were applied.  A 50mm 22ga Arrow echogenic stimulator needle was used. Sterile prep and drape,hand hygiene and sterile gloves were used.  Negative aspiration and negative test dose prior to incremental administration of local anesthetic. The patient tolerated the procedure well.  Ultrasound guidance: relevent anatomy identified, needle position confirmed, local anesthetic spread visualized around nerve(s), vascular puncture avoided.  Image printed for medical record.    Procedure Name: LMA Insertion Date/Time: 01/01/2016 1:06 PM Performed by: Genevieve NorlanderLINKA, Artie Takayama L Pre-anesthesia Checklist: Patient identified, Emergency Drugs available, Suction available, Patient being monitored and Timeout performed Patient Re-evaluated:Patient Re-evaluated prior to inductionOxygen Delivery Method: Circle system utilized Preoxygenation: Pre-oxygenation with 100% oxygen Intubation Type: IV induction Ventilation: Mask ventilation without  difficulty LMA: LMA inserted LMA Size: 4.0 Number of attempts: 1 Airway Equipment and Method: Bite block Placement Confirmation: positive ETCO2 Tube secured with: Tape Dental Injury: Teeth and Oropharynx as per pre-operative assessment

## 2016-01-01 NOTE — Anesthesia Postprocedure Evaluation (Signed)
Anesthesia Post Note  Patient: Mikayla [name]  Procedure(s) Performed: Procedure(s) (LRB): OPEN REDUCTION INTERNAL FIXATION (ORIF) RIGHT DISTAL RADIUS   (Right)  Patient location during evaluation: PACU Anesthesia Type: General Level of consciousness: awake and alert and patient cooperative Pain management: pain level controlled Vital Signs Assessment: post-procedure vital signs reviewed and stable Respiratory status: spontaneous breathing and respiratory function stable Cardiovascular status: stable Anesthetic complications: no    Last Vitals:  Filed Vitals:   01/01/16 1445 01/01/16 1500  BP: 142/87 135/82  Pulse: 81 77  Temp:    Resp: 20 13    Last Pain:  Filed Vitals:   01/01/16 1534  PainSc: 0-No pain                 Turner Baillie S

## 2016-01-01 NOTE — Discharge Instructions (Addendum)

## 2016-01-01 NOTE — Transfer of Care (Signed)
Immediate Anesthesia Transfer of Care Note  Patient: Mikayla [name]  Procedure(s) Performed: Procedure(s): OPEN REDUCTION INTERNAL FIXATION (ORIF) RIGHT DISTAL RADIUS   (Right)  Patient Location: PACU  Anesthesia Type:GA combined with regional for post-op pain  Level of Consciousness: awake and patient cooperative  Airway & Oxygen Therapy: Patient Spontanous Breathing and Patient connected to face mask oxygen  Post-op Assessment: Report given to RN and Post -op Vital signs reviewed and stable  Post vital signs: Reviewed and stable  Last Vitals:  Filed Vitals:   01/01/16 1230 01/01/16 1235  BP:  118/64  Pulse: 70 87  Temp:    Resp: 21 19    Last Pain:  Filed Vitals:   01/01/16 1236  PainSc: 5       Patients Stated Pain Goal: 2 (12/31/15 1517)  Complications: No apparent anesthesia complications

## 2016-01-01 NOTE — Op Note (Addendum)
Dictation Number [id number] Intra-operative fluoroscopic images in the AP, lateral, and oblique views were taken and evaluated by myself.  Reduction and hardware placement were confirmed.  There was no intraarticular penetration of permanent hardware.

## 2016-01-01 NOTE — H&P (Signed)
Mikayla [name] is an 75 y.o. female.   Chief Complaint: fracture right wrist HPI: Ms. Mikayla [name] is a 75 year old right hand dominant female, who suffered a fall on Monday, the 19th. She was attending a funeral when she slipped falling forward onto her outstretched hand, was seen in at an independent ER where x-rayed. She was placed in a splint and referred to Dr. Romeo AppleHarrison and he has referred her with a comminuted intraarticular fracture of her right distal radius. She has a prior history of a fracture of wrist and elbow as a child at the age of 89. She was placed on oxycodone. She is not complaining of any numbness or tingling. She is complaining of mild pain and discomfort in her wrist. No history of diabetes, thyroid problems, arthritis, or gout. Family history is negative for each of these also.  Past Medical History:  Diagnosis Date  . GERD (gastroesophageal reflux disease)  . Ulcerative colitis [facility](HCC)   Past Surgical History:  Procedure Laterality Date  . EYE SURGERY    Past Medical History  Diagnosis Date  . Hiatal hernia     small  . Internal hemorrhoids   . Glaucoma   . Pneumonia 1986  . History of bronchitis 1996  . History of migraine     NOTHING IN 80YRS   . Arthritis     MILD  . Chronic back pain     GOES TO CHIROPRACTOR EVERY OTHER MONTH  . GERD (gastroesophageal reflux disease)     TAKES ZEGERID DAILY   . IBS (irritable bowel syndrome)   . Complication of anesthesia     with hysterectomy in 1982  . PONV (postoperative nausea and vomiting)     Past Surgical History  Procedure Laterality Date  . Right oophorectomy    . Left oophorectomy      partial  . Appendectomy    . Esophagogastroduodenoscopy  01/08/2005    Dr. Jena Gaussourk- multiple distal esophageal erosions consistent with erosive reflux esophagitis, small hiatal hernia o/w normal stomach  . Colonoscopy  03/06/2004    Dr. Jena Gaussourk- internal hemorrhoids, o/w normal colon.  . Colonoscopy N/A 03/04/2014    Procedure:  COLONOSCOPY;  Surgeon: Corbin Adeobert M Rourk, MD;  Location: [facility];  Service: Endoscopy;  Laterality: N/A;  730  . Abdominal hysterectomy  1981  . Tonsillectomy      AS A CHILD  . Elbow/wrist surgery Right AT AGE 21  . Cataract extraction w/phaco Left 05/07/2015    Procedure: CATARACT EXTRACTION PHACO AND INTRAOCULAR LENS PLACEMENT (IOC) WITH MINI TUBE, PLACEMENT OF MITOSOL;  Surgeon: Chalmers Guestoy Whitaker, MD;  [facility];  Service: Ophthalmology;  Laterality: Left;    Family History  Problem Relation Age of Onset  . Colon cancer Neg Hx    Social History:  reports that she has never smoked. She has never used smokeless tobacco. She reports that she does not drink alcohol or use illicit drugs.  Allergies:  Allergies  Allergen Reactions  . Erythromycin Swelling    Tongue and lips swelling, redness around mouth and chin  . Penicillins Anaphylaxis and Swelling    Mouth swells Has patient had a PCN reaction causing immediate rash, facial/tongue/throat swelling, SOB or lightheadedness with hypotension: Yes Has patient had a PCN reaction causing severe rash involving mucus membranes or skin necrosis: No Has patient had a PCN reaction that required hospitalization Yes Has patient had a PCN reaction occurring within the last 10 years: No If all of the above answers  are "NO", then may proceed with Cephalosporin use.  . Carrot [Daucus Carota] Other (See Comments)    Cooked carrots causes abdominal pain  . Celery Oil Other (See Comments)    Cooked celery causes abdominal pain  . Gluten Meal Other (See Comments)    Abdominal pain  . Lactose Intolerance (Gi) Other (See Comments)    Per allergy test pt does not eat any dairy  . Latex Other (See Comments)    Dentist's gloves make inside of mouth raw    No prescriptions prior to admission    No results found for this or any previous visit (from the past 48 hour(s)).  No results found.   Pertinent items are noted in HPI.  There were no  vitals taken for this visit.  General appearance: alert, cooperative and appears stated age Head: Normocephalic, without obvious abnormality Neck: no JVD Resp: clear to auscultation bilaterally Cardio: regular rate and rhythm, S1, S2 normal, no murmur, click, rub or gallop GI: soft, non-tender; bowel sounds normal; no masses,  no organomegaly Extremities:deformity right wrist Pulses: 2+ and symmetric Skin: Skin color, texture, turgor normal. No rashes or lesions Neurologic: Grossly normal Incision/Wound: na  Assessment/Plan Assessment:  1. Closed die-punch intra-articular fracture of distal radius, right, initial encounter    Plan: PLAN: We would recommend open reduction and internal fixation with possible bone graft of the fracture. Pre-, peri- and postoperative course were discussed along with the risks and complications. She is aware there is no guarantee with the surgery. There is a possibility of infection, recurrence, injury to arteries, nerves, tendons; incomplete relief of symptoms, dystrophy. This is scheduled as an outpatient with possibility of allograft. Questions were encouraged and answered to her satisfaction. She is advised that there is always a potential that this may go on to a nonunion or have potential problems with tendons secondary to the plate. She does not appear to have a carpal tunnel syndrome at this time.   [name] 01/01/2016, 9:48 AM

## 2016-01-01 NOTE — CV Procedure (Signed)

## 2016-01-02 ENCOUNTER — Encounter (HOSPITAL_BASED_OUTPATIENT_CLINIC_OR_DEPARTMENT_OTHER): Payer: Self-pay | Admitting: Orthopedic Surgery

## 2016-01-02 NOTE — Op Note (Signed)
NAMMallory Shirk:  Branham, Angelica                 ACCOUNT NO.:  [id number]  MEDICAL RECORD NO.:  [id number]  LOCATION:                                 FACILITY:  PHYSICIAN:  Cindee SaltGary Charmayne Odell, M.D.       DATE OF BIRTH:  [birth year]  DATE OF PROCEDURE:  01/01/2016 DATE OF DISCHARGE:                              OPERATIVE REPORT   PREOPERATIVE DIAGNOSIS:  Comminuted intra-articular distal radius fracture, right wrist.  POSTOPERATIVE DIAGNOSIS: Comminuted intra-articular distal radius fracture, right wrist.  OPERATION:  Open reduction and internal fixation of 4-part plus fracture, right distal radius with Acumed volar distal radius plate.  SURGEON:  Cindee SaltGary Jaculin Rasmus, MD.  ANESTHESIA:  Supraclavicular block general.  ANESTHESIOLOGIST:  Achille RichAdam Hodierne, MD  [facility]  HISTORY:  The patient is a 75 year old female who suffered a fall approximately 5 days ago going to the funeral home.  She suffered a comminuted intra-articular distal radius fracture.  She is referred for definitive care.  Pre, peri, postoperative course have been discussed along with risks and complications.  She is aware that there is no guarantee with the surgery; the possibility of infection; recurrence of injury to arteries, nerves, tendons; incomplete relief of symptoms; dystrophy.  In the preoperative area, the patient was seen, the extremity marked by both the patient and surgeon.  Antibiotic given.  PROCEDURE IN DETAIL:  The patient was brought to the operating room.  A supraclavicular block carried out in the preoperative area by Dr. Billee Cashingdio. A general anesthetic carried in the operative suite.  She was prepped using ChloraPrep, supine position with the right arm free.  A 3-minute dry time was allowed.  Time-out taken, confirming the patient and procedure.  The limb was exsanguinated with an Esmarch bandage. Tourniquet placed high on the arm was inflated to 250 mmHg.  A volar incision was then made  over the flexor carpi radialis tendon, carried to the styloid, and then back in line with the flexor carpi radialis, styloid area of the distal radius.  This was carried down through subcutaneous tissue.  Bleeders were electrocauterized.  The sheath of the flexor carpi radialis was then incised. The deep sheath was then incised allowing visualization of the pronator quadratus.  This was incised off from the distal radius with protection of the radial artery. The dissection was carried distally.  The volar branch of the radial artery was protected.  The brachioradialis was then isolated.  This was released. The periosteum and pronator quadratus was then elevated off from the distal radius.  The fracture was then identified.  This was extremely distal.  It was in multiple pieces, at least 3-4 parts intra- articular in nature. The styloid was a free piece along with a central fracture fragment.  The fracture was opened.  The periosteum and soft tissues were removed within the fracture site.  This allowed the fracture to be reduced.  A narrow distal plate from Acumed was then selected.  This was placed and pinned in position.  X-rays using image intensification were taken. This revealed that the plate needs to be slightly adjusted.  This was rotated out of the  most distal wire.  This was then refixed, x-rayed, and found lied in good position.  The radial styloid however persisted in displacing.  This was then pinned with a 45 K wire. This stabilized the styloid.  X-rays confirmed that the fracture aligned in good position with good restoration of the articular surface and at least in neutral volar tilt.  The sliding screw was then placed. This was at 12 mm.  The distal pins were then inserted.  These measured between 18 and 22 mm with locking screws and the radial styloid. This required backing the styloid pin, which had been used for stabilization sequentially to allow placement of the radial  sided pegs and one of the styloid screws.  The remaining 2 proximal screws were then placed. These measured 14 and 12 mm.  X-rays confirmed that the fracture was well reduced, the articular surface reconstituted. The plate was against the bone, and lied in good position.  This included AP, lateral, oblique x-rays to be certain that none had penetrated through the articular surface.  The wound was copiously irrigated with saline.  The pronator quadratus was then repaired with figure-of-eight 4-0 Vicryl sutures, the subcutaneous tissue with interrupted 4-0 Vicryl, and the skin with interrupted 4-0 nylon sutures.  A sterile compressive dressing, dorsal palmar splint applied.  On deflation of the tourniquet, all fingers immediately pinked.  She was taken to the recovery room for observation in satisfactory condition.  She will be discharged home [facility] [place] [facility], on Percocet.    ______________________________ Cindee SaltGary Katey Barrie, M.D.   ______________________________ Cindee SaltGary Laiden Milles, M.D.    GK/MEDQ  D:  01/01/2016  T:  01/01/2016  Job:  [id number]

## 2016-01-09 DIAGNOSIS — M25531 Pain in right wrist: Secondary | ICD-10-CM | POA: Diagnosis not present

## 2016-01-09 DIAGNOSIS — S52571D Other intraarticular fracture of lower end of right radius, subsequent encounter for closed fracture with routine healing: Secondary | ICD-10-CM | POA: Diagnosis not present

## 2016-01-30 DIAGNOSIS — S52571D Other intraarticular fracture of lower end of right radius, subsequent encounter for closed fracture with routine healing: Secondary | ICD-10-CM | POA: Diagnosis not present

## 2016-02-02 DIAGNOSIS — M25631 Stiffness of right wrist, not elsewhere classified: Secondary | ICD-10-CM | POA: Diagnosis not present

## 2016-02-04 DIAGNOSIS — M25631 Stiffness of right wrist, not elsewhere classified: Secondary | ICD-10-CM | POA: Diagnosis not present

## 2016-02-04 DIAGNOSIS — M25649 Stiffness of unspecified hand, not elsewhere classified: Secondary | ICD-10-CM | POA: Diagnosis not present

## 2016-02-09 DIAGNOSIS — M25649 Stiffness of unspecified hand, not elsewhere classified: Secondary | ICD-10-CM | POA: Diagnosis not present

## 2016-02-11 DIAGNOSIS — M25649 Stiffness of unspecified hand, not elsewhere classified: Secondary | ICD-10-CM | POA: Diagnosis not present

## 2016-02-11 DIAGNOSIS — M25631 Stiffness of right wrist, not elsewhere classified: Secondary | ICD-10-CM | POA: Diagnosis not present

## 2016-02-16 DIAGNOSIS — M25631 Stiffness of right wrist, not elsewhere classified: Secondary | ICD-10-CM | POA: Diagnosis not present

## 2016-02-16 DIAGNOSIS — M25649 Stiffness of unspecified hand, not elsewhere classified: Secondary | ICD-10-CM | POA: Diagnosis not present

## 2016-02-18 DIAGNOSIS — M25649 Stiffness of unspecified hand, not elsewhere classified: Secondary | ICD-10-CM | POA: Diagnosis not present

## 2016-02-18 DIAGNOSIS — M25631 Stiffness of right wrist, not elsewhere classified: Secondary | ICD-10-CM | POA: Diagnosis not present

## 2016-02-20 DIAGNOSIS — S52571D Other intraarticular fracture of lower end of right radius, subsequent encounter for closed fracture with routine healing: Secondary | ICD-10-CM | POA: Diagnosis not present

## 2016-02-23 DIAGNOSIS — M25631 Stiffness of right wrist, not elsewhere classified: Secondary | ICD-10-CM | POA: Diagnosis not present

## 2016-02-23 DIAGNOSIS — M25649 Stiffness of unspecified hand, not elsewhere classified: Secondary | ICD-10-CM | POA: Diagnosis not present

## 2016-02-23 DIAGNOSIS — S52571D Other intraarticular fracture of lower end of right radius, subsequent encounter for closed fracture with routine healing: Secondary | ICD-10-CM | POA: Diagnosis not present

## 2016-02-25 DIAGNOSIS — M25649 Stiffness of unspecified hand, not elsewhere classified: Secondary | ICD-10-CM | POA: Diagnosis not present

## 2016-02-25 DIAGNOSIS — M25639 Stiffness of unspecified wrist, not elsewhere classified: Secondary | ICD-10-CM | POA: Diagnosis not present

## 2016-03-01 DIAGNOSIS — M25649 Stiffness of unspecified hand, not elsewhere classified: Secondary | ICD-10-CM | POA: Diagnosis not present

## 2016-03-01 DIAGNOSIS — M25639 Stiffness of unspecified wrist, not elsewhere classified: Secondary | ICD-10-CM | POA: Diagnosis not present

## 2016-03-03 DIAGNOSIS — M25639 Stiffness of unspecified wrist, not elsewhere classified: Secondary | ICD-10-CM | POA: Diagnosis not present

## 2016-03-08 DIAGNOSIS — M25639 Stiffness of unspecified wrist, not elsewhere classified: Secondary | ICD-10-CM | POA: Diagnosis not present

## 2016-03-08 DIAGNOSIS — M25649 Stiffness of unspecified hand, not elsewhere classified: Secondary | ICD-10-CM | POA: Diagnosis not present

## 2016-03-10 DIAGNOSIS — M25649 Stiffness of unspecified hand, not elsewhere classified: Secondary | ICD-10-CM | POA: Diagnosis not present

## 2016-03-10 DIAGNOSIS — M25639 Stiffness of unspecified wrist, not elsewhere classified: Secondary | ICD-10-CM | POA: Diagnosis not present

## 2016-03-16 DIAGNOSIS — M25639 Stiffness of unspecified wrist, not elsewhere classified: Secondary | ICD-10-CM | POA: Diagnosis not present

## 2016-03-16 DIAGNOSIS — M25631 Stiffness of right wrist, not elsewhere classified: Secondary | ICD-10-CM | POA: Diagnosis not present

## 2016-03-18 DIAGNOSIS — M25631 Stiffness of right wrist, not elsewhere classified: Secondary | ICD-10-CM | POA: Diagnosis not present

## 2016-03-18 DIAGNOSIS — M25639 Stiffness of unspecified wrist, not elsewhere classified: Secondary | ICD-10-CM | POA: Diagnosis not present

## 2016-03-22 DIAGNOSIS — S52571D Other intraarticular fracture of lower end of right radius, subsequent encounter for closed fracture with routine healing: Secondary | ICD-10-CM | POA: Diagnosis not present

## 2016-03-22 DIAGNOSIS — R531 Weakness: Secondary | ICD-10-CM | POA: Diagnosis not present

## 2016-03-22 DIAGNOSIS — M25631 Stiffness of right wrist, not elsewhere classified: Secondary | ICD-10-CM | POA: Diagnosis not present

## 2016-03-31 DIAGNOSIS — R531 Weakness: Secondary | ICD-10-CM | POA: Diagnosis not present

## 2016-03-31 DIAGNOSIS — M25639 Stiffness of unspecified wrist, not elsewhere classified: Secondary | ICD-10-CM | POA: Diagnosis not present

## 2016-04-07 DIAGNOSIS — R531 Weakness: Secondary | ICD-10-CM | POA: Diagnosis not present

## 2016-08-06 DIAGNOSIS — H401133 Primary open-angle glaucoma, bilateral, severe stage: Secondary | ICD-10-CM | POA: Diagnosis not present

## 2016-08-06 DIAGNOSIS — H04123 Dry eye syndrome of bilateral lacrimal glands: Secondary | ICD-10-CM | POA: Diagnosis not present

## 2016-12-09 DIAGNOSIS — H401133 Primary open-angle glaucoma, bilateral, severe stage: Secondary | ICD-10-CM | POA: Diagnosis not present

## 2017-01-21 IMAGING — DX DG WRIST COMPLETE 3+V*R*
3 series · 3 of 3 positions shown · non-contrast
Comparison: None.

CLINICAL DATA: Fall today.  Right wrist pain and swelling.

EXAM:
RIGHT WRIST - COMPLETE 3+ VIEW

[wrist pa]
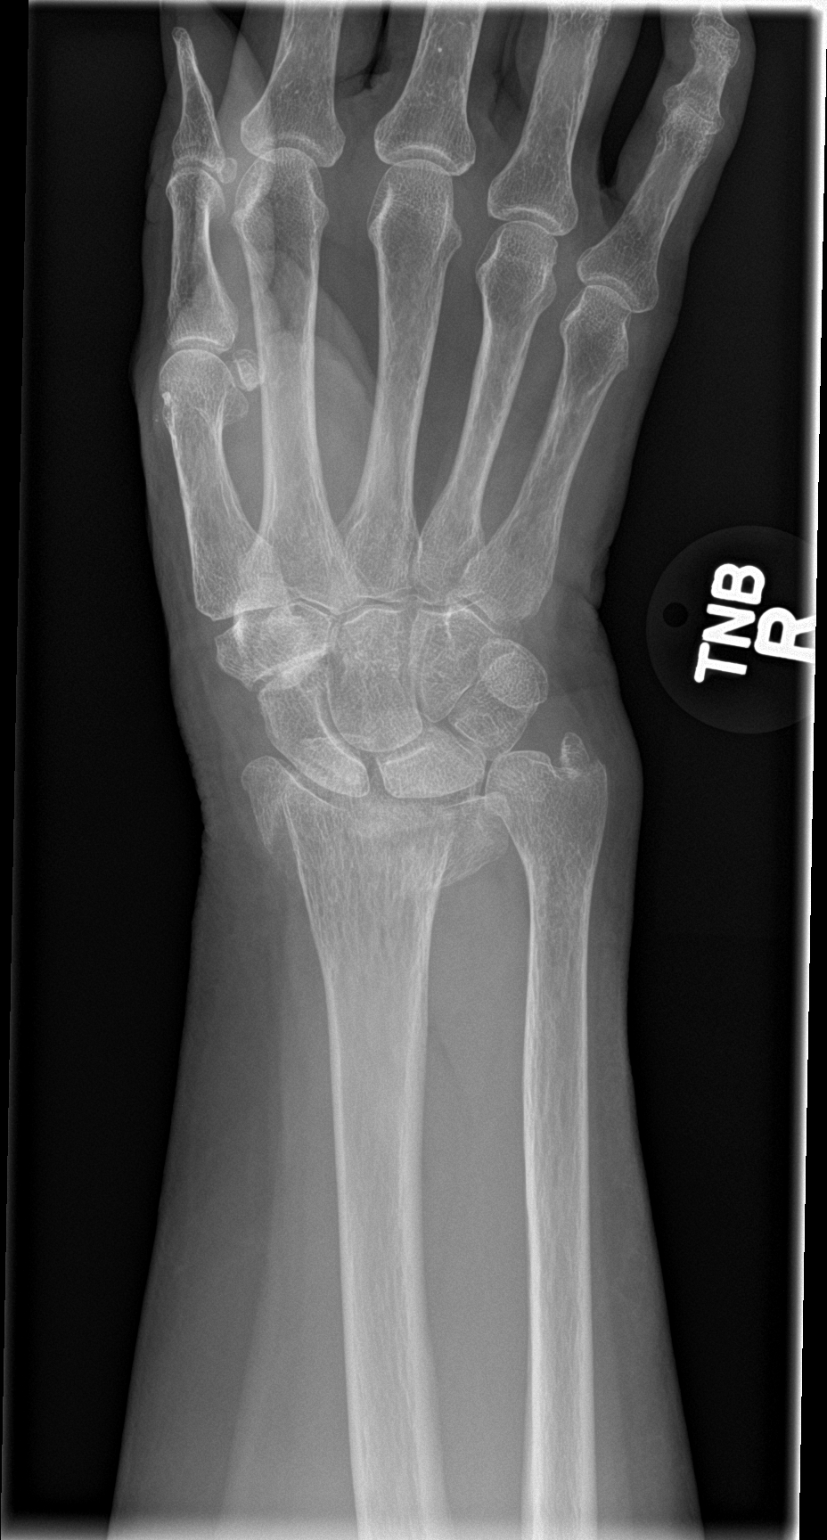

[wrist obl]
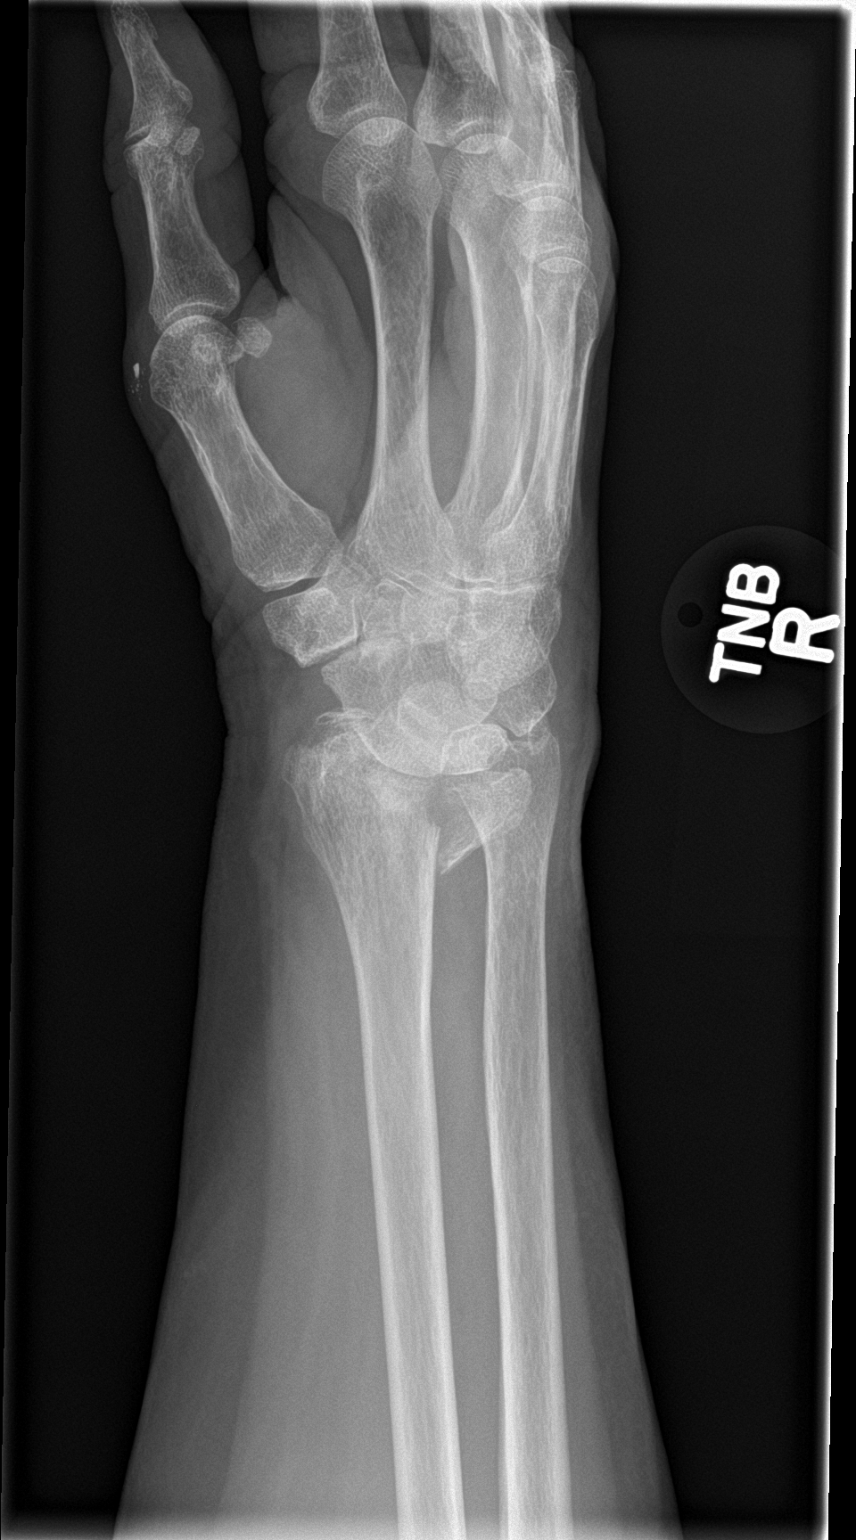

[wrist lat]
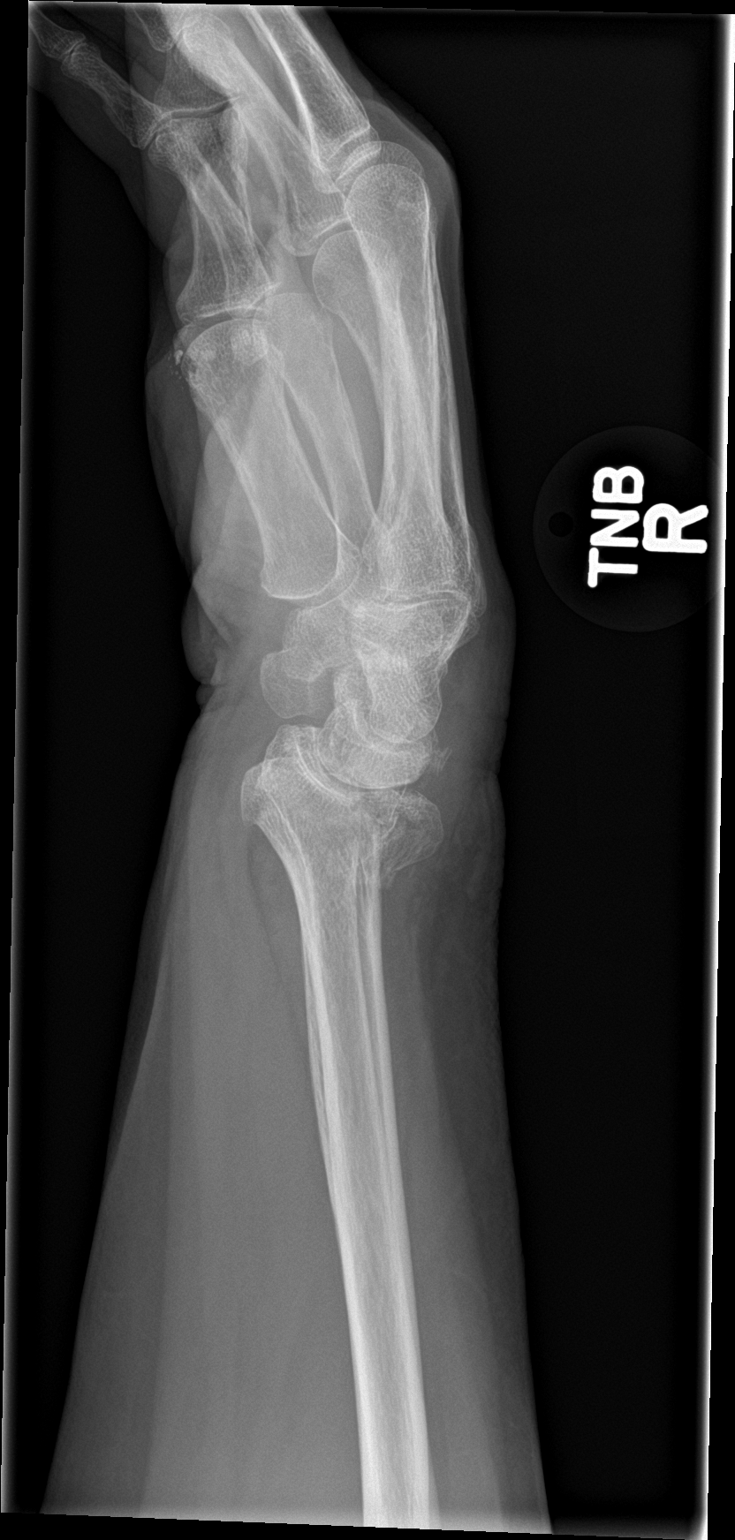

[3 of 3 positions shown; findings below may reference images not displayed]

FINDINGS: There is an impacted, displaced intra-articular distal right radial
fracture. Mild posterior angulation. Ulnar styloid fracture also
noted.
IMPRESSION: Mildly impacted, displaced and angulated distal right radial
fracture. Ulnar styloid fracture.

## 2017-02-07 DIAGNOSIS — H401131 Primary open-angle glaucoma, bilateral, mild stage: Secondary | ICD-10-CM | POA: Diagnosis not present

## 2017-03-17 DIAGNOSIS — H04123 Dry eye syndrome of bilateral lacrimal glands: Secondary | ICD-10-CM | POA: Diagnosis not present

## 2017-03-17 DIAGNOSIS — H2511 Age-related nuclear cataract, right eye: Secondary | ICD-10-CM | POA: Diagnosis not present

## 2017-03-17 DIAGNOSIS — Z961 Presence of intraocular lens: Secondary | ICD-10-CM | POA: Diagnosis not present

## 2017-03-17 DIAGNOSIS — H401133 Primary open-angle glaucoma, bilateral, severe stage: Secondary | ICD-10-CM | POA: Diagnosis not present

## 2021-08-16 DIAGNOSIS — Z91040 Latex allergy status: Secondary | ICD-10-CM | POA: Insufficient documentation

## 2021-08-16 DIAGNOSIS — Z7982 Long term (current) use of aspirin: Secondary | ICD-10-CM | POA: Insufficient documentation

## 2021-08-16 DIAGNOSIS — N3001 Acute cystitis with hematuria: Secondary | ICD-10-CM | POA: Diagnosis not present

## 2021-08-16 DIAGNOSIS — R300 Dysuria: Secondary | ICD-10-CM | POA: Diagnosis present

## 2021-08-17 ENCOUNTER — Emergency Department (HOSPITAL_COMMUNITY)
Admission: EM | Admit: 2021-08-17 | Discharge: 2021-08-17 | Disposition: A | Discharge status: Home / Self Care | Payer: Medicare HMO | Attending: Emergency Medicine | Admitting: Emergency Medicine

## 2021-08-17 ENCOUNTER — Other Ambulatory Visit: Payer: Self-pay

## 2021-08-17 ENCOUNTER — Encounter (HOSPITAL_COMMUNITY): Payer: Self-pay

## 2021-08-17 DIAGNOSIS — N3001 Acute cystitis with hematuria: Secondary | ICD-10-CM

## 2021-08-17 DIAGNOSIS — R319 Hematuria, unspecified: Secondary | ICD-10-CM

## 2021-08-17 LAB — URINALYSIS, ROUTINE W REFLEX MICROSCOPIC

## 2021-08-17 LAB — URINALYSIS, MICROSCOPIC (REFLEX)
RBC / HPF: >50 RBC/hpf (ref 0–5)
WBC, UA: >50 WBC/hpf (ref 0–5)

## 2021-08-17 MED ORDER — NITROFURANTOIN MONOHYD MACRO 100 MG PO CAPS: 100.0000 mg | ORAL_CAPSULE | Freq: Two times a day (BID) | ORAL | 0 refills | Status: AC

## 2021-08-17 MED ORDER — NITROFURANTOIN MACROCRYSTAL 100 MG PO CAPS
100.0000 mg | ORAL_CAPSULE | Freq: Once | ORAL | Status: AC
  Administered 2021-08-17: 100 mg via ORAL
  Filled 2021-08-17: qty 1

## 2021-08-17 NOTE — ED Triage Notes (Signed)
Pt arrived via POV c/o hematuria that began apprx 2030 this evening. Pt reports attempting to check BP at home but was unable to obtain a reading. Pt has no Hx of issues with her BP.

## 2021-08-17 NOTE — ED Provider Notes (Signed)
[facility] EMERGENCY DEPARTMENT Provider Note   CSN: [id number] Arrival date & time: 08/16/21  2356     History  Chief Complaint  Patient presents with   Hematuria    Mikayla [name] is a 81 y.o. female.  Onset of hematuria around 2030. Also has had increased frequency and dysuria during that time. No pain otherwise. No h/o kidney stones. Had pyelo in the past per report. No fevers. No history of any other ysmptoms prior to this. No h/o kidney disease. No edema or dyspnea.    Hematuria      Home Medications Prior to Admission medications   Medication Sig Start Date End Date Taking? Authorizing Provider  nitrofurantoin, macrocrystal-monohydrate, (MACROBID) 100 MG capsule Take 1 capsule (100 mg total) by mouth 2 (two) times daily for 7 days. 08/17/21 08/24/21 Yes Jones Viviani, Corene Cornea, MD  Ascorbic Acid (VITAMIN C) 1000 MG tablet Take 1,000 mg by mouth 2 (two) times daily.     [provider]  aspirin EC 81 MG tablet Take 81 mg by mouth daily.    [provider]  Aspirin-Caffeine (BAYER BACK & BODY PO) Take 1 tablet by mouth 2 (two) times daily as needed (pain).    [provider]  b complex vitamins tablet Take 1 tablet by mouth daily.    [provider]  bimatoprost (LUMIGAN) 0.01 % SOLN Place 1 drop into both eyes at bedtime.    [provider]  Carboxymethylcellul-Glycerin (REFRESH OPTIVE OP) Place 1 drop into both eyes 3 (three) times daily as needed (dry eyes).     [provider]  cholecalciferol (VITAMIN D) 400 units TABS tablet Take 400 Units by mouth.    [provider]  cycloSPORINE (RESTASIS) 0.05 % ophthalmic emulsion Place 1 drop into both eyes every 12 (twelve) hours.     [provider]  dorzolamide (TRUSOPT) 2 % ophthalmic solution Place 1 drop into both eyes 3 (three) times daily. 04/15/15   [provider]  Lutein 40 MG CAPS Take 40 mg by mouth daily with lunch.    [provider]   Multiple Vitamins-Minerals (ICAPS) CAPS Take 1 capsule by mouth 2 (two) times daily.     [provider]  Omega-3 Fatty Acids (OMEGA-3 PLUS PO) Take 1 capsule by mouth daily.    [provider]  Omeprazole-Sodium Bicarbonate (ZEGERID PO) Take by mouth.    [provider]  OVER THE COUNTER MEDICATION Take 1 tablet by mouth daily as needed (allergies). Flora Sinus    [provider]  oxyCODONE-acetaminophen (PERCOCET) 5-325 MG tablet Take 1 tablet by mouth every 4 (four) hours as needed. 12/29/15   Nat Christen, MD  oxyCODONE-acetaminophen (PERCOCET) 7.5-325 MG tablet Take 1 tablet by mouth every 4 (four) hours as needed for severe pain. 01/01/16   Daryll Brod, MD  Polyethyl Glycol-Propyl Glycol (SYSTANE ULTRA OP) Place 1 drop into both eyes 3 (three) times daily as needed (dry eyes).    [provider]  Probiotic Product (ALIGN) 4 MG CAPS Take 4 mg by mouth every morning.     [provider]  vitamin B-12 (CYANOCOBALAMIN) 500 MCG tablet Take 500 mcg by mouth daily.    [provider]      Allergies    Erythromycin, Penicillins, Carrot [daucus carota], Celery oil, Gluten meal, Lactose intolerance (gi), and Latex    Review of Systems   Review of Systems  Genitourinary:  Positive for hematuria.   Physical Exam Updated Vital  Signs BP (!) 116/55 (BP Location: Left Arm)    Pulse 84    Temp 97.8 F (36.6 C) (Oral)    Resp 16    Ht 5\' 5"  (1.651 m)    Wt 77.1 kg    SpO2 98%    BMI 28.29 kg/m  Physical Exam Vitals and nursing note reviewed.  HENT:     Head: Normocephalic and atraumatic.     Mouth/Throat:     Mouth: Mucous membranes are moist.  Eyes:     Pupils: Pupils are equal, round, and reactive to light.  Abdominal:     General: Abdomen is flat.  Musculoskeletal:        General: No swelling or tenderness. Normal range of motion.  Skin:    General: Skin is warm and dry.  Neurological:     General: No focal deficit present.     ED Results / Procedures / Treatments   Labs (all labs ordered are listed, but only abnormal results are displayed) Labs Reviewed  URINALYSIS, ROUTINE W REFLEX MICROSCOPIC - Abnormal; Notable for the following components:      Result Value   Color, Urine RED (*)    APPearance TURBID (*)    Glucose, UA   (*)    Value: TEST NOT REPORTED DUE TO COLOR INTERFERENCE OF URINE PIGMENT   Hgb urine dipstick   (*)    Value: TEST NOT REPORTED DUE TO COLOR INTERFERENCE OF URINE PIGMENT   Bilirubin Urine   (*)    Value: TEST NOT REPORTED DUE TO COLOR INTERFERENCE OF URINE PIGMENT   Ketones, ur   (*)    Value: TEST NOT REPORTED DUE TO COLOR INTERFERENCE OF URINE PIGMENT   Protein, ur   (*)    Value: TEST NOT REPORTED DUE TO COLOR INTERFERENCE OF URINE PIGMENT   Nitrite   (*)    Value: TEST NOT REPORTED DUE TO COLOR INTERFERENCE OF URINE PIGMENT   Leukocytes,Ua   (*)    Value: TEST NOT REPORTED DUE TO COLOR INTERFERENCE OF URINE PIGMENT   All other components within normal limits  URINALYSIS, MICROSCOPIC (REFLEX) - Abnormal; Notable for the following components:   Bacteria, UA MANY (*)    All other components within normal limits  URINE CULTURE    EKG None  Radiology No results found.  Procedures Procedures    Medications Ordered in ED Medications  nitrofurantoin (MACRODANTIN) capsule 100 mg (100 mg Oral Given 08/17/21 0132)    ED Course/ Medical Decision Making/ A&P                           Medical Decision Making Amount and/or Complexity of Data Reviewed Labs: ordered.  Risk Prescription drug management.   Suspect UTI however, if UA negative will consider other etiology workup such as kidney stone, renal failure, etc.   Significant blood and bacteria in urine. Will culture and tx for presumed UTI. Doubt RCC or kidney stone without pain, nausea, vomiting and quick onset with other more likely diagnoses.  Pcp follow up to ensure improvement.   Final Clinical  Impression(s) / ED Diagnoses Final diagnoses:  Hematuria, unspecified type  Acute cystitis with hematuria    Rx / DC Orders ED Discharge Orders          Ordered    nitrofurantoin, macrocrystal-monohydrate, (MACROBID) 100 MG capsule  2 times daily        08/17/21 0113  Eesa Justiss, Corene Cornea, MD 08/17/21 [phone]

## 2021-08-20 LAB — URINE CULTURE: Culture: >=100000 — AB

## 2021-08-21 ENCOUNTER — Telehealth: Payer: Self-pay | Admitting: *Deleted

## 2021-08-21 NOTE — Telephone Encounter (Signed)
Post ED Visit - Positive Culture Follow-up  Culture report reviewed by antimicrobial stewardship pharmacist: [facility] []  , Pharm.D. []  Enzo Bi, Pharm.D., BCPS AQ-ID []  , Pharm.D., BCPS []  Celedonio Miyamoto, Pharm.D., BCPS []  [place], Garvin Fila.D., BCPS, AAHIVP []  , Pharm.D., BCPS, AAHIVP []  Georgina Pillion, PharmD, BCPS []  , PharmD, BCPS []  [place], PharmD, BCPS []  [address], PharmD []  , PharmD, BCPS []  Estella Husk, PharmD  Pharmacy Team []  Lysle Pearl, PharmD []  , PharmD []  Phillips Climes, PharmD []  , Rph []  Agapito Games) , PharmD []  Verlan Friends, PharmD []  , PharmD []  Mervyn Gay, PharmD []  , PharmD []  Vinnie Level, PharmD []  [facility], PharmD []  , PharmD []  Len Childs, PharmD   Positive urine culture Treated with Nitrofurantoin Nonohyd Macro, organism sensitive to the same and no further patient follow-up is required at this time. , PharmD  Greer Pickerel Talley 08/21/2021, 8:37 AM

## 2021-08-24 ENCOUNTER — Ambulatory Visit (INDEPENDENT_AMBULATORY_CARE_PROVIDER_SITE_OTHER): Payer: Medicare HMO | Admitting: Urology

## 2021-08-24 ENCOUNTER — Other Ambulatory Visit: Payer: Self-pay

## 2021-08-24 ENCOUNTER — Encounter: Payer: Self-pay | Admitting: Urology

## 2021-08-24 VITALS — BP 147/78 | HR 76 | Wt 170.0 lb

## 2021-08-24 DIAGNOSIS — N3001 Acute cystitis with hematuria: Secondary | ICD-10-CM | POA: Diagnosis not present

## 2021-08-24 DIAGNOSIS — N159 Renal tubulo-interstitial disease, unspecified: Secondary | ICD-10-CM

## 2021-08-24 DIAGNOSIS — R310 Gross hematuria: Secondary | ICD-10-CM | POA: Diagnosis not present

## 2021-08-24 LAB — URINALYSIS, ROUTINE W REFLEX MICROSCOPIC
Bilirubin, UA: NEGATIVE
Glucose, UA: NEGATIVE
Ketones, UA: NEGATIVE
Leukocytes,UA: NEGATIVE
Nitrite, UA: NEGATIVE
Protein,UA: NEGATIVE
Specific Gravity, UA: <1.005 — ABNORMAL LOW (ref 1.005–1.030)
Urobilinogen, Ur: 0.2 mg/dL (ref 0.2–1.0)
pH, UA: 6.5 (ref 5.0–7.5)

## 2021-08-24 LAB — MICROSCOPIC EXAMINATION
Bacteria, UA: NONE SEEN
Renal Epithel, UA: NONE SEEN /hpf

## 2021-08-24 NOTE — Progress Notes (Signed)
08/24/2021 1:32 PM   Mikayla [name] 1940-07-27 [id number]  Referring provider: Glenda Chroman, MD [place],  [place] [address]  Gross hematuria   HPI: Mikayla [name] is a 81yo here for evaluation of gross hematuria. She was seen in the ER 2/6 with gross hematuria. No hx of tobacco abuse. No hx of nephrolithiasis. 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 She denies any significant LUTS. No straining to urinate. Urine culture from the ER grew E coli. Her urine has cleared since starting the antibiotics. UA today is normal  PMH: Past Medical History:  Diagnosis Date   Arthritis    MILD   Chronic back pain    GOES TO CHIROPRACTOR EVERY OTHER MONTH   Complication of anesthesia    with hysterectomy in 1982   GERD (gastroesophageal reflux disease)    TAKES ZEGERID DAILY    Glaucoma    Hiatal hernia    small   History of bronchitis 1996   History of migraine    NOTHING IN 71YRS    IBS (irritable bowel syndrome)    Internal hemorrhoids    Pneumonia 1986   PONV (postoperative nausea and vomiting)     Surgical History: Past Surgical History:  Procedure Laterality Date   ABDOMINAL HYSTERECTOMY  1981   APPENDECTOMY     CATARACT EXTRACTION W/PHACO Left 05/07/2015   Procedure: CATARACT EXTRACTION PHACO AND INTRAOCULAR LENS PLACEMENT ([place]) WITH MINI TUBE, PLACEMENT OF MITOSOL;  Surgeon: Marylynn Pearson, MD;  Location: [facility];  Service: Ophthalmology;  Laterality: Left;   COLONOSCOPY  03/06/2004   Dr. Gala Romney- internal hemorrhoids, o/w normal colon.   COLONOSCOPY N/A 03/04/2014   Procedure: COLONOSCOPY;  Surgeon: Daneil Dolin, MD;  [facility];  Service: Endoscopy;  Laterality: N/A;  730   ELBOW/WRIST SURGERY Right AT AGE 55   ESOPHAGOGASTRODUODENOSCOPY  01/08/2005   Dr. Gala Romney- multiple distal esophageal erosions consistent with erosive reflux esophagitis, small hiatal hernia o/w normal stomach   LEFT OOPHORECTOMY     partial   OPEN REDUCTION INTERNAL FIXATION (ORIF) DISTAL RADIAL  FRACTURE Right 01/01/2016   Procedure: OPEN REDUCTION INTERNAL FIXATION (ORIF) RIGHT DISTAL RADIUS  ;  Surgeon: Daryll Brod, MD;  Location: [facility];  Service: Orthopedics;  Laterality: Right;   RIGHT OOPHORECTOMY     TONSILLECTOMY     AS A CHILD    Home Medications:  Allergies as of 08/24/2021       Reactions   Erythromycin Swelling   Tongue and lips swelling, redness around mouth and chin   Penicillins Anaphylaxis, Swelling   Mouth swells Has patient had a PCN reaction causing immediate rash, facial/tongue/throat swelling, SOB or lightheadedness with hypotension: Yes Has patient had a PCN reaction causing severe rash involving mucus membranes or skin necrosis: No Has patient had a PCN reaction that required hospitalization Yes Has patient had a PCN reaction occurring within the last 10 years: No If all of the above answers are "NO", then may proceed with Cephalosporin use.   Carrot [daucus Carota] Other (See Comments)   Cooked carrots causes abdominal pain   Celery Oil Other (See Comments)   Cooked celery causes abdominal pain   Gluten Meal Other (See Comments)   Abdominal pain   Lactose Intolerance (gi) Other (See Comments)   Per allergy test pt does not eat any dairy   Latex Other (See Comments)   Dentist's gloves make inside of mouth raw  Medication List        Accurate as of August 24, 2021  1:32 PM. If you have any questions, ask your nurse or doctor.          Align 4 MG Caps Take 4 mg by mouth every morning.   aspirin EC 81 MG tablet Take 81 mg by mouth daily.   b complex vitamins tablet Take 1 tablet by mouth daily.   BAYER BACK & BODY PO Take 1 tablet by mouth 2 (two) times daily as needed (pain).   bimatoprost 0.01 % Soln Commonly known as: LUMIGAN Place 1 drop into both eyes at bedtime.   cholecalciferol 10 MCG (400 UNIT) Tabs tablet Commonly known as: VITAMIN D3 Take 400 Units by mouth.   cycloSPORINE 0.05 % ophthalmic  emulsion Commonly known as: RESTASIS Place 1 drop into both eyes every 12 (twelve) hours.   dorzolamide 2 % ophthalmic solution Commonly known as: TRUSOPT Place 1 drop into both eyes 3 (three) times daily.   ICaps Caps Take 1 capsule by mouth 2 (two) times daily.   Lutein 40 MG Caps Take 40 mg by mouth daily with lunch.   nitrofurantoin (macrocrystal-monohydrate) 100 MG capsule Commonly known as: MACROBID Take 1 capsule (100 mg total) by mouth 2 (two) times daily for 7 days.   OMEGA-3 PLUS PO Take 1 capsule by mouth daily.   OVER THE COUNTER MEDICATION Take 1 tablet by mouth daily as needed (allergies). Flora Sinus   oxyCODONE-acetaminophen 5-325 MG tablet Commonly known as: Percocet Take 1 tablet by mouth every 4 (four) hours as needed.   oxyCODONE-acetaminophen 7.5-325 MG tablet Commonly known as: Percocet Take 1 tablet by mouth every 4 (four) hours as needed for severe pain.   REFRESH OPTIVE OP Place 1 drop into both eyes 3 (three) times daily as needed (dry eyes).   SYSTANE ULTRA OP Place 1 drop into both eyes 3 (three) times daily as needed (dry eyes).   vitamin B-12 500 MCG tablet Commonly known as: CYANOCOBALAMIN Take 500 mcg by mouth daily.   vitamin C 1000 MG tablet Take 1,000 mg by mouth 2 (two) times daily.   ZEGERID PO Take by mouth.        Allergies:  Allergies  Allergen Reactions   Erythromycin Swelling    Tongue and lips swelling, redness around mouth and chin   Penicillins Anaphylaxis and Swelling    Mouth swells Has patient had a PCN reaction causing immediate rash, facial/tongue/throat swelling, SOB or lightheadedness with hypotension: Yes Has patient had a PCN reaction causing severe rash involving mucus membranes or skin necrosis: No Has patient had a PCN reaction that required hospitalization Yes Has patient had a PCN reaction occurring within the last 10 years: No If all of the above answers are "NO", then may proceed with  Cephalosporin use.   Carrot [Daucus Carota] Other (See Comments)    Cooked carrots causes abdominal pain   Celery Oil Other (See Comments)    Cooked celery causes abdominal pain   Gluten Meal Other (See Comments)    Abdominal pain   Lactose Intolerance (Gi) Other (See Comments)    Per allergy test pt does not eat any dairy   Latex Other (See Comments)    Dentist's gloves make inside of mouth raw    Family History: Family History  Problem Relation Age of Onset   Colon cancer Neg Hx     Social History:  reports that she has never smoked. She has  never used smokeless tobacco. She reports that she does not drink alcohol and does not use drugs.  ROS: All other review of systems were reviewed and are negative except what is noted above in HPI  Physical Exam: BP (!) 147/78    Pulse 76    Wt 170 lb (77.1 kg)    BMI 28.29 kg/m   Constitutional:  Alert and oriented, No acute distress. HEENT: [place] AT, moist mucus membranes.  Trachea midline, no masses. Cardiovascular: No clubbing, cyanosis, or edema. Respiratory: Normal respiratory effort, no increased work of breathing. GI: Abdomen is soft, nontender, nondistended, no abdominal masses GU: No CVA tenderness.  Lymph: No cervical or inguinal lymphadenopathy. Skin: No rashes, bruises or suspicious lesions. Neurologic: Grossly intact, no focal deficits, moving all 4 extremities. Psychiatric: Normal mood and affect.  Laboratory Data: Lab Results  Component Value Date   WBC 4.9 05/07/2015   HGB 13.4 05/07/2015   HCT 40.8 05/07/2015   MCV 91.7 05/07/2015   PLT 165 05/07/2015    No results found for: CREATININE  No results found for: PSA  No results found for: TESTOSTERONE  No results found for: HGBA1C  Urinalysis    Component Value Date/Time   COLORURINE RED (A) 08/17/2021 0025   APPEARANCEUR TURBID (A) 08/17/2021 0025   LABSPEC  08/17/2021 0025    TEST NOT REPORTED DUE TO COLOR INTERFERENCE OF URINE PIGMENT   PHURINE   08/17/2021 0025    TEST NOT REPORTED DUE TO COLOR INTERFERENCE OF URINE PIGMENT   GLUCOSEU (A) 08/17/2021 0025    TEST NOT REPORTED DUE TO COLOR INTERFERENCE OF URINE PIGMENT   HGBUR (A) 08/17/2021 0025    TEST NOT REPORTED DUE TO COLOR INTERFERENCE OF URINE PIGMENT   BILIRUBINUR (A) 08/17/2021 0025    TEST NOT REPORTED DUE TO COLOR INTERFERENCE OF URINE PIGMENT   KETONESUR (A) 08/17/2021 0025    TEST NOT REPORTED DUE TO COLOR INTERFERENCE OF URINE PIGMENT   PROTEINUR (A) 08/17/2021 0025    TEST NOT REPORTED DUE TO COLOR INTERFERENCE OF URINE PIGMENT   NITRITE (A) 08/17/2021 0025    TEST NOT REPORTED DUE TO COLOR INTERFERENCE OF URINE PIGMENT   LEUKOCYTESUR (A) 08/17/2021 0025    TEST NOT REPORTED DUE TO COLOR INTERFERENCE OF URINE PIGMENT    Lab Results  Component Value Date   BACTERIA MANY (A) 08/17/2021    Pertinent Imaging:  No results found for this or any previous visit.  No results found for this or any previous visit.  No results found for this or any previous visit.  No results found for this or any previous visit.  No results found for this or any previous visit.  No results found for this or any previous visit.  No results found for this or any previous visit.  No results found for this or any previous visit.   Assessment & Plan:    1. Gross hematuria Likely related to acute cystitis. Her UA today is normal which is reassuring. I will see her back in 6 months with a UA  2. Acute cystitis with hematuria -We discussed the natural hx of UTIs and the various causes. We discussed the treatment options including observation, post coital prophylaxis, daily prophylaxis, topical estrogen therapy. After discussing the options we have agreed to pursue observation   No follow-ups on file.  Nicolette Bang, MD  [facility] [place]

## 2021-08-24 NOTE — Patient Instructions (Signed)

## 2021-09-01 ENCOUNTER — Encounter: Payer: Self-pay | Admitting: Urology

## 2021-09-08 DIAGNOSIS — H401133 Primary open-angle glaucoma, bilateral, severe stage: Secondary | ICD-10-CM | POA: Diagnosis not present

## 2021-09-08 DIAGNOSIS — H2511 Age-related nuclear cataract, right eye: Secondary | ICD-10-CM | POA: Diagnosis not present

## 2021-09-08 DIAGNOSIS — H47233 Glaucomatous optic atrophy, bilateral: Secondary | ICD-10-CM | POA: Diagnosis not present

## 2022-02-22 ENCOUNTER — Encounter: Payer: Self-pay | Admitting: Urology

## 2022-02-22 ENCOUNTER — Ambulatory Visit (INDEPENDENT_AMBULATORY_CARE_PROVIDER_SITE_OTHER): Payer: Medicare HMO | Admitting: Urology

## 2022-02-22 VITALS — BP 149/78 | HR 83 | Ht 64.0 in | Wt 169.0 lb

## 2022-02-22 DIAGNOSIS — R310 Gross hematuria: Secondary | ICD-10-CM | POA: Diagnosis not present

## 2022-02-22 DIAGNOSIS — N159 Renal tubulo-interstitial disease, unspecified: Secondary | ICD-10-CM

## 2022-02-22 NOTE — Patient Instructions (Signed)

## 2022-02-22 NOTE — Progress Notes (Signed)
02/22/2022 3:04 PM   Mikayla [name] April 24, 1941 [id number]  Referring provider: Ignatius Specking, MD [address] [place],  [place] [address]  Followup gross hematuria   HPI: Mikayla [name] is a 80yo here for followup for gross hematuria. No hematuria episodes since last visit. UA today shows no blood. She denies any significant LUTS. No other complaints today   PMH: Past Medical History:  Diagnosis Date   Arthritis    MILD   Chronic back pain    GOES TO CHIROPRACTOR EVERY OTHER MONTH   Complication of anesthesia    with hysterectomy in 1982   GERD (gastroesophageal reflux disease)    TAKES ZEGERID DAILY    Glaucoma    Hiatal hernia    small   History of bronchitis 1996   History of migraine    NOTHING IN 46YRS    IBS (irritable bowel syndrome)    Internal hemorrhoids    Pneumonia 1986   PONV (postoperative nausea and vomiting)     Surgical History: Past Surgical History:  Procedure Laterality Date   ABDOMINAL HYSTERECTOMY  1981   APPENDECTOMY     CATARACT EXTRACTION W/PHACO Left 05/07/2015   Procedure: CATARACT EXTRACTION PHACO AND INTRAOCULAR LENS PLACEMENT ([facility]) WITH MINI TUBE, PLACEMENT OF MITOSOL;  Surgeon: Chalmers Guest, MD;  Location: [facility];  Service: Ophthalmology;  Laterality: Left;   COLONOSCOPY  03/06/2004   Dr. Jena Gauss- internal hemorrhoids, o/w normal colon.   COLONOSCOPY N/A 03/04/2014   Procedure: COLONOSCOPY;  Surgeon: Corbin Ade, MD;  [facility];  Service: Endoscopy;  Laterality: N/A;  730   ELBOW/WRIST SURGERY Right AT AGE 32   ESOPHAGOGASTRODUODENOSCOPY  01/08/2005   Dr. Jena Gauss- multiple distal esophageal erosions consistent with erosive reflux esophagitis, small hiatal hernia o/w normal stomach   LEFT OOPHORECTOMY     partial   OPEN REDUCTION INTERNAL FIXATION (ORIF) DISTAL RADIAL FRACTURE Right 01/01/2016   Procedure: OPEN REDUCTION INTERNAL FIXATION (ORIF) RIGHT DISTAL RADIUS  ;  Surgeon: Cindee Salt, MD;  Location: [facility];   Service: Orthopedics;  Laterality: Right;   RIGHT OOPHORECTOMY     TONSILLECTOMY     AS A CHILD    Home Medications:  Allergies as of 02/22/2022       Reactions   Erythromycin Swelling   Tongue and lips swelling, redness around mouth and chin   Penicillins Anaphylaxis, Swelling   Mouth swells Has patient had a PCN reaction causing immediate rash, facial/tongue/throat swelling, SOB or lightheadedness with hypotension: Yes Has patient had a PCN reaction causing severe rash involving mucus membranes or skin necrosis: No Has patient had a PCN reaction that required hospitalization Yes Has patient had a PCN reaction occurring within the last 10 years: No If all of the above answers are "NO", then may proceed with Cephalosporin use.   Carrot [daucus Carota] Other (See Comments)   Cooked carrots causes abdominal pain   Celery Oil Other (See Comments)   Cooked celery causes abdominal pain   Gluten Meal Other (See Comments)   Abdominal pain   Lactose Intolerance (gi) Other (See Comments)   Per allergy test pt does not eat any dairy   Latex Other (See Comments)   Dentist's gloves make inside of mouth raw        Medication List        Accurate as of February 22, 2022  3:04 PM. If you have any questions, ask your nurse or doctor.  Align 4 MG Caps Take 4 mg by mouth every morning.   aspirin EC 81 MG tablet Take 81 mg by mouth daily.   b complex vitamins tablet Take 1 tablet by mouth daily.   BAYER BACK & BODY PO Take 1 tablet by mouth 2 (two) times daily as needed (pain).   bimatoprost 0.01 % Soln Commonly known as: LUMIGAN Place 1 drop into both eyes at bedtime.   cholecalciferol 10 MCG (400 UNIT) Tabs tablet Commonly known as: VITAMIN D3 Take 400 Units by mouth.   cycloSPORINE 0.05 % ophthalmic emulsion Commonly known as: RESTASIS Place 1 drop into both eyes every 12 (twelve) hours.   dorzolamide 2 % ophthalmic solution Commonly known as: TRUSOPT Place 1  drop into both eyes 3 (three) times daily.   ICaps Caps Take 1 capsule by mouth 2 (two) times daily.   Lutein 40 MG Caps Take 40 mg by mouth daily with lunch.   OMEGA-3 PLUS PO Take 1 capsule by mouth daily.   OVER THE COUNTER MEDICATION Take 1 tablet by mouth daily as needed (allergies). Flora Sinus   oxyCODONE-acetaminophen 5-325 MG tablet Commonly known as: Percocet Take 1 tablet by mouth every 4 (four) hours as needed.   oxyCODONE-acetaminophen 7.5-325 MG tablet Commonly known as: Percocet Take 1 tablet by mouth every 4 (four) hours as needed for severe pain.   REFRESH OPTIVE OP Place 1 drop into both eyes 3 (three) times daily as needed (dry eyes).   SYSTANE ULTRA OP Place 1 drop into both eyes 3 (three) times daily as needed (dry eyes).   vitamin B-12 500 MCG tablet Commonly known as: CYANOCOBALAMIN Take 500 mcg by mouth daily.   vitamin C 1000 MG tablet Take 1,000 mg by mouth 2 (two) times daily.   ZEGERID PO Take by mouth.        Allergies:  Allergies  Allergen Reactions   Erythromycin Swelling    Tongue and lips swelling, redness around mouth and chin   Penicillins Anaphylaxis and Swelling    Mouth swells Has patient had a PCN reaction causing immediate rash, facial/tongue/throat swelling, SOB or lightheadedness with hypotension: Yes Has patient had a PCN reaction causing severe rash involving mucus membranes or skin necrosis: No Has patient had a PCN reaction that required hospitalization Yes Has patient had a PCN reaction occurring within the last 10 years: No If all of the above answers are "NO", then may proceed with Cephalosporin use.   Carrot [Daucus Carota] Other (See Comments)    Cooked carrots causes abdominal pain   Celery Oil Other (See Comments)    Cooked celery causes abdominal pain   Gluten Meal Other (See Comments)    Abdominal pain   Lactose Intolerance (Gi) Other (See Comments)    Per allergy test pt does not eat any dairy    Latex Other (See Comments)    Dentist's gloves make inside of mouth raw    Family History: Family History  Problem Relation Age of Onset   Colon cancer Neg Hx     Social History:  reports that she has never smoked. She has never used smokeless tobacco. She reports that she does not drink alcohol and does not use drugs.  ROS: All other review of systems were reviewed and are negative except what is noted above in HPI  Physical Exam: BP (!) 149/78   Pulse 83   Ht 5\' 4"  (1.626 m)   Wt 169 lb (76.7 kg)   BMI  29.01 kg/m   Constitutional:  Alert and oriented, No acute distress. HEENT: [place] AT, moist mucus membranes.  Trachea midline, no masses. Cardiovascular: No clubbing, cyanosis, or edema. Respiratory: Normal respiratory effort, no increased work of breathing. GI: Abdomen is soft, nontender, nondistended, no abdominal masses GU: No CVA tenderness.  Lymph: No cervical or inguinal lymphadenopathy. Skin: No rashes, bruises or suspicious lesions. Neurologic: Grossly intact, no focal deficits, moving all 4 extremities. Psychiatric: Normal mood and affect.  Laboratory Data: Lab Results  Component Value Date   WBC 4.9 05/07/2015   HGB 13.4 05/07/2015   HCT 40.8 05/07/2015   MCV 91.7 05/07/2015   PLT 165 05/07/2015    No results found for: "CREATININE"  No results found for: "PSA"  No results found for: "TESTOSTERONE"  No results found for: "HGBA1C"  Urinalysis    Component Value Date/Time   COLORURINE RED (A) 08/17/2021 0025   APPEARANCEUR Clear 08/24/2021 1320   LABSPEC  08/17/2021 0025    TEST NOT REPORTED DUE TO COLOR INTERFERENCE OF URINE PIGMENT   PHURINE  08/17/2021 0025    TEST NOT REPORTED DUE TO COLOR INTERFERENCE OF URINE PIGMENT   GLUCOSEU Negative 08/24/2021 1320   HGBUR (A) 08/17/2021 0025    TEST NOT REPORTED DUE TO COLOR INTERFERENCE OF URINE PIGMENT   BILIRUBINUR Negative 08/24/2021 1320   KETONESUR (A) 08/17/2021 0025    TEST NOT REPORTED DUE TO  COLOR INTERFERENCE OF URINE PIGMENT   PROTEINUR Negative 08/24/2021 1320   PROTEINUR (A) 08/17/2021 0025    TEST NOT REPORTED DUE TO COLOR INTERFERENCE OF URINE PIGMENT   NITRITE Negative 08/24/2021 1320   NITRITE (A) 08/17/2021 0025    TEST NOT REPORTED DUE TO COLOR INTERFERENCE OF URINE PIGMENT   LEUKOCYTESUR Negative 08/24/2021 1320   LEUKOCYTESUR (A) 08/17/2021 0025    TEST NOT REPORTED DUE TO COLOR INTERFERENCE OF URINE PIGMENT    Lab Results  Component Value Date   LABMICR See below: 08/24/2021   WBCUA 0-5 08/24/2021   LABEPIT 0-10 08/24/2021   BACTERIA None seen 08/24/2021    Pertinent Imaging:  No results found for this or any previous visit.  No results found for this or any previous visit.  No results found for this or any previous visit.  No results found for this or any previous visit.  No results found for this or any previous visit.  No results found for this or any previous visit.  No results found for this or any previous visit.  No results found for this or any previous visit.   Assessment & Plan:    1. Gross hematuria -resolved -RCT 1 year with UA - Urinalysis, Routine w reflex microscopic   No follow-ups on file.  Wilkie Aye, MD  [facility] [place]

## 2022-02-24 LAB — MICROSCOPIC EXAMINATION: Bacteria, UA: NONE SEEN

## 2022-02-24 LAB — URINALYSIS, ROUTINE W REFLEX MICROSCOPIC
Bilirubin, UA: NEGATIVE
Glucose, UA: NEGATIVE
Ketones, UA: NEGATIVE
Nitrite, UA: NEGATIVE
Protein,UA: NEGATIVE
Specific Gravity, UA: 1.015 (ref 1.005–1.030)
Urobilinogen, Ur: 0.2 mg/dL (ref 0.2–1.0)
pH, UA: 7 (ref 5.0–7.5)

## 2022-03-09 DIAGNOSIS — H2511 Age-related nuclear cataract, right eye: Secondary | ICD-10-CM | POA: Diagnosis not present

## 2022-03-09 DIAGNOSIS — H47233 Glaucomatous optic atrophy, bilateral: Secondary | ICD-10-CM | POA: Diagnosis not present

## 2022-03-09 DIAGNOSIS — H401133 Primary open-angle glaucoma, bilateral, severe stage: Secondary | ICD-10-CM | POA: Diagnosis not present

## 2022-06-09 DIAGNOSIS — H47233 Glaucomatous optic atrophy, bilateral: Secondary | ICD-10-CM | POA: Diagnosis not present

## 2022-06-09 DIAGNOSIS — H401133 Primary open-angle glaucoma, bilateral, severe stage: Secondary | ICD-10-CM | POA: Diagnosis not present

## 2022-06-09 DIAGNOSIS — H2511 Age-related nuclear cataract, right eye: Secondary | ICD-10-CM | POA: Diagnosis not present

## 2022-08-10 NOTE — Progress Notes (Unsigned)
[place] Phone: [phone] Subjective:   Fontaine No, am serving as a scribe for Dr. Hulan Saas.  I'm seeing this patient by the request  of:  Vyas, Dhruv B, MD  CC: Bilateral knee pain  [id number]  Mikayla [name] is a 82 y.o. female coming in with complaint of bilateral knee pain. Patient states that her pain began on January 4th. Feels like pain is deep in th joint. On R leg she has pain over distal ITB. Standing, stairs, and walking increases her pain. Uses Aspirin, Blue Emu and [place] gel for pain relief. Has also tried hot and cold therapies. Last time someone looked at her knees was in 1993 when she had the L knee evaluated and was told that she had arthritis. Uses cane for longer walks.   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     Past Medical History:  Diagnosis Date   Arthritis    MILD   Chronic back pain    GOES TO CHIROPRACTOR EVERY OTHER MONTH   Complication of anesthesia    with hysterectomy in 1982   GERD (gastroesophageal reflux disease)    TAKES ZEGERID DAILY    Glaucoma    Hiatal hernia    small   History of bronchitis 1996   History of migraine    NOTHING IN 25YRS    IBS (irritable bowel syndrome)    Internal hemorrhoids    Pneumonia 1986   PONV (postoperative nausea and vomiting)    Past Surgical History:  Procedure Laterality Date   ABDOMINAL HYSTERECTOMY  1981   APPENDECTOMY     CATARACT EXTRACTION W/PHACO Left 05/07/2015   Procedure: CATARACT EXTRACTION PHACO AND INTRAOCULAR LENS PLACEMENT ([place]) WITH MINI TUBE, PLACEMENT OF MITOSOL;  Surgeon: Marylynn Pearson, MD;  Location: [facility];  Service: Ophthalmology;  Laterality: Left;   COLONOSCOPY  03/06/2004   Dr. Gala Romney- internal hemorrhoids, o/w normal colon.   COLONOSCOPY N/A 03/04/2014   Procedure: COLONOSCOPY;  Surgeon: Daneil Dolin, MD;  [facility];  Service: Endoscopy;  Laterality: N/A;  730   ELBOW/WRIST SURGERY  Right AT AGE 46   ESOPHAGOGASTRODUODENOSCOPY  01/08/2005   Dr. Gala Romney- multiple distal esophageal erosions consistent with erosive reflux esophagitis, small hiatal hernia o/w normal stomach   LEFT OOPHORECTOMY     partial   OPEN REDUCTION INTERNAL FIXATION (ORIF) DISTAL RADIAL FRACTURE Right 01/01/2016   Procedure: OPEN REDUCTION INTERNAL FIXATION (ORIF) RIGHT DISTAL RADIUS  ;  Surgeon: Daryll Brod, MD;  Location: [facility];  Service: Orthopedics;  Laterality: Right;   RIGHT OOPHORECTOMY     TONSILLECTOMY     AS A CHILD   Social History   Socioeconomic History   Marital status: Widowed    Spouse name: Not on file   Number of children: Not on file   Years of education: Not on file   Highest education level: Not on file  Occupational History   Not on file  Tobacco Use   Smoking status: Never   Smokeless tobacco: Never  Vaping Use   Vaping Use: Never used  Substance and Sexual Activity   Alcohol use: No   Drug use: No   Sexual activity: Not Currently    Birth control/protection: Surgical  Other Topics Concern   Not on file  Social History Narrative   Not on file   Social Determinants of Health   Financial Resource Strain: Not on file  Food Insecurity: Not on file  Transportation Needs: Not on file  Physical Activity: Not on file  Stress: Not on file  Social Connections: Not on file   Allergies  Allergen Reactions   Erythromycin Swelling    Tongue and lips swelling, redness around mouth and chin   Penicillins Anaphylaxis and Swelling    Mouth swells Has patient had a PCN reaction causing immediate rash, facial/tongue/throat swelling, SOB or lightheadedness with hypotension: Yes Has patient had a PCN reaction causing severe rash involving mucus membranes or skin necrosis: No Has patient had a PCN reaction that required hospitalization Yes Has patient had a PCN reaction occurring within the last 10 years: No If all of the above answers are "NO", then may  proceed with Cephalosporin use.   Carrot [Daucus Carota] Other (See Comments)    Cooked carrots causes abdominal pain   Celery Oil Other (See Comments)    Cooked celery causes abdominal pain   Gluten Meal Other (See Comments)    Abdominal pain   Lactose Intolerance (Gi) Other (See Comments)    Per allergy test pt does not eat any dairy   Latex Other (See Comments)    Dentist's gloves make inside of mouth raw   Family History  Problem Relation Age of Onset   Colon cancer Neg Hx        Current Outpatient Medications (Analgesics):    aspirin EC 81 MG tablet, Take 81 mg by mouth daily.   Aspirin-Caffeine (BAYER BACK & BODY PO), Take 1 tablet by mouth 2 (two) times daily as needed (pain).   oxyCODONE-acetaminophen (PERCOCET) 5-325 MG tablet, Take 1 tablet by mouth every 4 (four) hours as needed.   oxyCODONE-acetaminophen (PERCOCET) 7.5-325 MG tablet, Take 1 tablet by mouth every 4 (four) hours as needed for severe pain.  Current Outpatient Medications (Hematological):    vitamin B-12 (CYANOCOBALAMIN) 500 MCG tablet, Take 500 mcg by mouth daily.  Current Outpatient Medications (Other):    Ascorbic Acid (VITAMIN C) 1000 MG tablet, Take 1,000 mg by mouth 2 (two) times daily.    b complex vitamins tablet, Take 1 tablet by mouth daily.   bimatoprost (LUMIGAN) 0.01 % SOLN, Place 1 drop into both eyes at bedtime.   Carboxymethylcellul-Glycerin (REFRESH OPTIVE OP), Place 1 drop into both eyes 3 (three) times daily as needed (dry eyes).    cholecalciferol (VITAMIN D) 400 units TABS tablet, Take 400 Units by mouth.   cycloSPORINE (RESTASIS) 0.05 % ophthalmic emulsion, Place 1 drop into both eyes every 12 (twelve) hours.    dorzolamide (TRUSOPT) 2 % ophthalmic solution, Place 1 drop into both eyes 3 (three) times daily.   Lutein 40 MG CAPS, Take 40 mg by mouth daily with lunch.   Multiple Vitamins-Minerals (ICAPS) CAPS, Take 1 capsule by mouth 2 (two) times daily.    Omega-3 Fatty Acids  (OMEGA-3 PLUS PO), Take 1 capsule by mouth daily.   Omeprazole-Sodium Bicarbonate (ZEGERID PO), Take by mouth.   OVER THE COUNTER MEDICATION, Take 1 tablet by mouth daily as needed (allergies). Flora Sinus   Polyethyl Glycol-Propyl Glycol (SYSTANE ULTRA OP), Place 1 drop into both eyes 3 (three) times daily as needed (dry eyes).   Probiotic Product (ALIGN) 4 MG CAPS, Take 4 mg by mouth every morning.    Reviewed prior external information including notes and imaging from  primary care provider As well as notes that were available from care everywhere and other healthcare systems.  Past medical history, social, surgical and  family history all reviewed in electronic medical record.  No pertanent information unless stated regarding to the chief complaint.   Review of Systems:  No headache, visual changes, nausea, vomiting, diarrhea, constipation, dizziness, abdominal pain, skin rash, fevers, chills, night sweats, weight loss, swollen lymph nodes, body aches, joint swelling, chest pain, shortness of breath, mood changes. POSITIVE muscle aches  Objective  Blood pressure 138/84, pulse 79, height 5\' 4"  (1.626 m), weight 175 lb (79.4 kg), SpO2 98 %.   General: No apparent distress alert and oriented x3 mood and affect normal, dressed appropriately.  HEENT: Pupils equal, extraocular movements intact  Respiratory: Patient's speak in full sentences and does not appear short of breath  Cardiovascular: No lower extremity edema, non tender, no erythema  Knee exam shows patient does have some degenerative crepitus noted.  Lateral tracking of the patella bilaterally but left greater than right.  Patient does have a positive patella patellar grind on the left side.  Limited muscular skeletal ultrasound was performed and interpreted by Hulan Saas, M  Limited ultrasound shows the patient does have significant narrowing of the patellofemoral joint.  Patient does have some moderate arthritic changes about  the medial and lateral joint space of the right greater than left as well.  No significant loose bodies noted. 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

## 2022-08-11 ENCOUNTER — Ambulatory Visit: Payer: Medicare HMO | Admitting: Family Medicine

## 2022-08-11 ENCOUNTER — Ambulatory Visit: Payer: Self-pay

## 2022-08-11 ENCOUNTER — Encounter: Payer: Self-pay | Admitting: Family Medicine

## 2022-08-11 ENCOUNTER — Ambulatory Visit (INDEPENDENT_AMBULATORY_CARE_PROVIDER_SITE_OTHER): Payer: Medicare HMO

## 2022-08-11 VITALS — BP 138/84 | HR 79 | Ht 64.0 in | Wt 175.0 lb

## 2022-08-11 DIAGNOSIS — M1710 Unilateral primary osteoarthritis, unspecified knee: Secondary | ICD-10-CM | POA: Diagnosis not present

## 2022-08-11 DIAGNOSIS — M25562 Pain in left knee: Secondary | ICD-10-CM

## 2022-08-11 DIAGNOSIS — M25561 Pain in right knee: Secondary | ICD-10-CM

## 2022-08-11 DIAGNOSIS — G8929 Other chronic pain: Secondary | ICD-10-CM

## 2022-08-11 NOTE — Patient Instructions (Signed)
Xray today Exercises Ice 20 min 2x a day Tart cherry extract 1200mg  at night See me again in 2 months

## 2022-08-11 NOTE — Assessment & Plan Note (Signed)
Significant improvement.  Ultrasound does show some trace effusion swelling noted on the right knee.  We discussed an injection but with patient making some improvement she would like to hold at this time.  Discussed topical anti-inflammatories, icing regimen, which activities to do and which ones to avoid.  Increase activity slowly.  Follow-up again in 6 to 8 weeks.

## 2022-09-08 DIAGNOSIS — H401133 Primary open-angle glaucoma, bilateral, severe stage: Secondary | ICD-10-CM | POA: Diagnosis not present

## 2022-09-08 DIAGNOSIS — H2511 Age-related nuclear cataract, right eye: Secondary | ICD-10-CM | POA: Diagnosis not present

## 2022-09-08 DIAGNOSIS — H47233 Glaucomatous optic atrophy, bilateral: Secondary | ICD-10-CM | POA: Diagnosis not present

## 2022-10-05 NOTE — Progress Notes (Signed)
[place] Phone: [phone] Subjective:   I, Mikayla [name], am serving as a scribe for Dr. Hulan Saas.' I'm seeing this patient by the request  of:  Jerene Bears B, MD  CC: Knee pain  [id number]  08/11/2022 Significant improvement. Ultrasound does show some trace effusion swelling noted on the right knee. We discussed an injection but with patient making some improvement she would like to hold at this time. Discussed topical anti-inflammatories, icing regimen, which activities to do and which ones to avoid. Increase activity slowly. Follow-up again in 6 to 8 weeks   Updated 10/13/2022 Mikayla [name] is a 82 y.o. female coming in with complaint of knee pain. Doing well. No pain today. Will only have pain if she steps the wrong way or is on uneven ground. Ices her knees every day. NO new concerns.  Xrays IMPRESSION: Mild-to-moderate degenerative changes.     Past Medical History:  Diagnosis Date   Arthritis    MILD   Chronic back pain    GOES TO CHIROPRACTOR EVERY OTHER MONTH   Complication of anesthesia    with hysterectomy in 1982   GERD (gastroesophageal reflux disease)    TAKES ZEGERID DAILY    Glaucoma    Hiatal hernia    small   History of bronchitis 1996   History of migraine    NOTHING IN 75YRS    IBS (irritable bowel syndrome)    Internal hemorrhoids    Pneumonia 1986   PONV (postoperative nausea and vomiting)    Past Surgical History:  Procedure Laterality Date   ABDOMINAL HYSTERECTOMY  1981   APPENDECTOMY     CATARACT EXTRACTION W/PHACO Left 05/07/2015   Procedure: CATARACT EXTRACTION PHACO AND INTRAOCULAR LENS PLACEMENT ([place]) WITH MINI TUBE, PLACEMENT OF MITOSOL;  Surgeon: Marylynn Pearson, MD;  Location: [facility];  Service: Ophthalmology;  Laterality: Left;   COLONOSCOPY  03/06/2004   Dr. Gala Romney- internal hemorrhoids, o/w normal colon.   COLONOSCOPY N/A 03/04/2014   Procedure: COLONOSCOPY;   Surgeon: Daneil Dolin, MD;  [facility];  Service: Endoscopy;  Laterality: N/A;  730   ELBOW/WRIST SURGERY Right AT AGE 82   ESOPHAGOGASTRODUODENOSCOPY  01/08/2005   Dr. Gala Romney- multiple distal esophageal erosions consistent with erosive reflux esophagitis, small hiatal hernia o/w normal stomach   LEFT OOPHORECTOMY     partial   OPEN REDUCTION INTERNAL FIXATION (ORIF) DISTAL RADIAL FRACTURE Right 01/01/2016   Procedure: OPEN REDUCTION INTERNAL FIXATION (ORIF) RIGHT DISTAL RADIUS  ;  Surgeon: Daryll Brod, MD;  Location: [facility];  Service: Orthopedics;  Laterality: Right;   RIGHT OOPHORECTOMY     TONSILLECTOMY     AS A CHILD   Social History   Socioeconomic History   Marital status: Widowed    Spouse name: Not on file   Number of children: Not on file   Years of education: Not on file   Highest education level: Not on file  Occupational History   Not on file  Tobacco Use   Smoking status: Never   Smokeless tobacco: Never  Vaping Use   Vaping Use: Never used  Substance and Sexual Activity   Alcohol use: No   Drug use: No   Sexual activity: Not Currently    Birth control/protection: Surgical  Other Topics Concern   Not on file  Social History Narrative   Not on file   Social Determinants of Health  Financial Resource Strain: Not on file  Food Insecurity: Not on file  Transportation Needs: Not on file  Physical Activity: Not on file  Stress: Not on file  Social Connections: Not on file   Allergies  Allergen Reactions   Erythromycin Swelling    Tongue and lips swelling, redness around mouth and chin   Penicillins Anaphylaxis and Swelling    Mouth swells Has patient had a PCN reaction causing immediate rash, facial/tongue/throat swelling, SOB or lightheadedness with hypotension: Yes Has patient had a PCN reaction causing severe rash involving mucus membranes or skin necrosis: No Has patient had a PCN reaction that required hospitalization  Yes Has patient had a PCN reaction occurring within the last 10 years: No If all of the above answers are "NO", then may proceed with Cephalosporin use.   Carrot [Daucus Carota] Other (See Comments)    Cooked carrots causes abdominal pain   Celery Oil Other (See Comments)    Cooked celery causes abdominal pain   Gluten Meal Other (See Comments)    Abdominal pain   Lactose Intolerance (Gi) Other (See Comments)    Per allergy test pt does not eat any dairy   Latex Other (See Comments)    Dentist's gloves make inside of mouth raw   Family History  Problem Relation Age of Onset   Colon cancer Neg Hx        Current Outpatient Medications (Analgesics):    aspirin EC 81 MG tablet, Take 81 mg by mouth daily.   Aspirin-Caffeine (BAYER BACK & BODY PO), Take 1 tablet by mouth 2 (two) times daily as needed (pain).   oxyCODONE-acetaminophen (PERCOCET) 5-325 MG tablet, Take 1 tablet by mouth every 4 (four) hours as needed.   oxyCODONE-acetaminophen (PERCOCET) 7.5-325 MG tablet, Take 1 tablet by mouth every 4 (four) hours as needed for severe pain.  Current Outpatient Medications (Hematological):    vitamin B-12 (CYANOCOBALAMIN) 500 MCG tablet, Take 500 mcg by mouth daily.  Current Outpatient Medications (Other):    Ascorbic Acid (VITAMIN C) 1000 MG tablet, Take 1,000 mg by mouth 2 (two) times daily.    b complex vitamins tablet, Take 1 tablet by mouth daily.   bimatoprost (LUMIGAN) 0.01 % SOLN, Place 1 drop into both eyes at bedtime.   Carboxymethylcellul-Glycerin (REFRESH OPTIVE OP), Place 1 drop into both eyes 3 (three) times daily as needed (dry eyes).    cholecalciferol (VITAMIN D) 400 units TABS tablet, Take 400 Units by mouth.   cycloSPORINE (RESTASIS) 0.05 % ophthalmic emulsion, Place 1 drop into both eyes every 12 (twelve) hours.    dorzolamide (TRUSOPT) 2 % ophthalmic solution, Place 1 drop into both eyes 3 (three) times daily.   Lutein 40 MG CAPS, Take 40 mg by mouth daily with  lunch.   Multiple Vitamins-Minerals (ICAPS) CAPS, Take 1 capsule by mouth 2 (two) times daily.    Omega-3 Fatty Acids (OMEGA-3 PLUS PO), Take 1 capsule by mouth daily.   Omeprazole-Sodium Bicarbonate (ZEGERID PO), Take by mouth.   OVER THE COUNTER MEDICATION, Take 1 tablet by mouth daily as needed (allergies). Flora Sinus   Polyethyl Glycol-Propyl Glycol (SYSTANE ULTRA OP), Place 1 drop into both eyes 3 (three) times daily as needed (dry eyes).   Probiotic Product (ALIGN) 4 MG CAPS, Take 4 mg by mouth every morning.     Objective  Blood pressure 116/64, pulse 79, height 5\' 4"  (1.626 m), SpO2 96 %.   General: No apparent distress alert and oriented x3 mood  and affect normal, dressed appropriately.  HEENT: Pupils equal, extraocular movements intact  Respiratory: Patient's speak in full sentences and does not appear short of breath  Cardiovascular: No lower extremity edema, non tender, no erythema  Patient's knees do have some mild crepitus noted right greater than left.  No significant instability noted at the moment.  Patient has no significant fluid noted.    Impression and Recommendations:     The above documentation has been reviewed and is accurate and complete Lyndal Pulley, DO

## 2022-10-13 ENCOUNTER — Ambulatory Visit: Payer: Medicare HMO | Admitting: Family Medicine

## 2022-10-13 VITALS — BP 116/64 | HR 79 | Ht 64.0 in

## 2022-10-13 DIAGNOSIS — M1710 Unilateral primary osteoarthritis, unspecified knee: Secondary | ICD-10-CM

## 2022-10-13 NOTE — Assessment & Plan Note (Signed)
Patient is doing significantly better at this time.  Discussed with patient's about the icing regimen as well as continuing to stay active.  Patient will and is in the perfect patient at the moment can follow-up with me in 3 months if needed or if doing significantly well can follow-up with me as needed

## 2022-10-13 NOTE — Patient Instructions (Signed)
Good to see you! You should do wonderful Make no changes See you again in 3 months or as needed

## 2023-01-12 ENCOUNTER — Ambulatory Visit: Payer: Medicare HMO | Admitting: Family Medicine

## 2023-02-01 DIAGNOSIS — H401133 Primary open-angle glaucoma, bilateral, severe stage: Secondary | ICD-10-CM | POA: Diagnosis not present

## 2023-02-01 DIAGNOSIS — H524 Presbyopia: Secondary | ICD-10-CM | POA: Diagnosis not present

## 2023-02-23 ENCOUNTER — Ambulatory Visit: Payer: Medicare Other | Admitting: Urology

## 2023-03-08 DIAGNOSIS — M79674 Pain in right toe(s): Secondary | ICD-10-CM | POA: Diagnosis not present

## 2023-03-08 DIAGNOSIS — M79675 Pain in left toe(s): Secondary | ICD-10-CM | POA: Diagnosis not present

## 2023-03-08 DIAGNOSIS — B351 Tinea unguium: Secondary | ICD-10-CM | POA: Diagnosis not present

## 2023-03-08 DIAGNOSIS — L609 Nail disorder, unspecified: Secondary | ICD-10-CM | POA: Diagnosis not present

## 2023-03-08 DIAGNOSIS — M79672 Pain in left foot: Secondary | ICD-10-CM | POA: Diagnosis not present

## 2023-03-08 DIAGNOSIS — M79671 Pain in right foot: Secondary | ICD-10-CM | POA: Diagnosis not present

## 2023-03-09 DIAGNOSIS — H2511 Age-related nuclear cataract, right eye: Secondary | ICD-10-CM | POA: Diagnosis not present

## 2023-03-09 DIAGNOSIS — H401133 Primary open-angle glaucoma, bilateral, severe stage: Secondary | ICD-10-CM | POA: Diagnosis not present

## 2023-03-09 DIAGNOSIS — H47233 Glaucomatous optic atrophy, bilateral: Secondary | ICD-10-CM | POA: Diagnosis not present

## 2023-03-09 DIAGNOSIS — L602 Onychogryphosis: Secondary | ICD-10-CM | POA: Diagnosis not present

## 2023-03-16 DIAGNOSIS — H401133 Primary open-angle glaucoma, bilateral, severe stage: Secondary | ICD-10-CM | POA: Diagnosis not present

## 2023-03-16 DIAGNOSIS — H2511 Age-related nuclear cataract, right eye: Secondary | ICD-10-CM | POA: Diagnosis not present

## 2023-03-16 DIAGNOSIS — H47233 Glaucomatous optic atrophy, bilateral: Secondary | ICD-10-CM | POA: Diagnosis not present

## 2023-03-25 ENCOUNTER — Ambulatory Visit: Payer: Medicare HMO | Admitting: Urology

## 2023-03-25 ENCOUNTER — Encounter: Payer: Self-pay | Admitting: Urology

## 2023-03-25 VITALS — BP 115/71 | HR 74

## 2023-03-25 DIAGNOSIS — R310 Gross hematuria: Secondary | ICD-10-CM | POA: Diagnosis not present

## 2023-03-25 NOTE — Progress Notes (Unsigned)
03/25/2023 11:19 AM   Mikayla [name] January 18, 1941 [id number]  Referring provider: Ignatius Specking, MD [address] [place],  [place] [address]  Followup gross hematuria  HPI: Mikayla [name] is a 81yo here for followup for hematuria. UA today shows no RBCs.  No flank pain. No UTIs since last visit.    PMH: Past Medical History:  Diagnosis Date   Arthritis    MILD   Chronic back pain    GOES TO CHIROPRACTOR EVERY OTHER MONTH   Complication of anesthesia    with hysterectomy in 1982   GERD (gastroesophageal reflux disease)    TAKES ZEGERID DAILY    Glaucoma    Hiatal hernia    small   History of bronchitis 1996   History of migraine    NOTHING IN 1YRS    IBS (irritable bowel syndrome)    Internal hemorrhoids    Pneumonia 1986   PONV (postoperative nausea and vomiting)     Surgical History: Past Surgical History:  Procedure Laterality Date   ABDOMINAL HYSTERECTOMY  1981   APPENDECTOMY     CATARACT EXTRACTION W/PHACO Left 05/07/2015   Procedure: CATARACT EXTRACTION PHACO AND INTRAOCULAR LENS PLACEMENT (IOC) WITH MINI TUBE, PLACEMENT OF MITOSOL;  Surgeon: Chalmers Guest, MD;  Location: [facility] OR;  Service: Ophthalmology;  Laterality: Left;   COLONOSCOPY  03/06/2004   Dr. Jena Gauss- internal hemorrhoids, o/w normal colon.   COLONOSCOPY N/A 03/04/2014   Procedure: COLONOSCOPY;  Surgeon: Corbin Ade, MD;  Location: [facility];  Service: Endoscopy;  Laterality: N/A;  730   ELBOW/WRIST SURGERY Right AT AGE 37   ESOPHAGOGASTRODUODENOSCOPY  01/08/2005   Dr. Jena Gauss- multiple distal esophageal erosions consistent with erosive reflux esophagitis, small hiatal hernia o/w normal stomach   LEFT OOPHORECTOMY     partial   OPEN REDUCTION INTERNAL FIXATION (ORIF) DISTAL RADIAL FRACTURE Right 01/01/2016   Procedure: OPEN REDUCTION INTERNAL FIXATION (ORIF) RIGHT DISTAL RADIUS  ;  Surgeon: Cindee Salt, MD;  Location: [facility];  Service: Orthopedics;  Laterality: Right;   RIGHT OOPHORECTOMY      TONSILLECTOMY     AS A CHILD    Home Medications:  Allergies as of 03/25/2023       Reactions   Erythromycin Swelling   Tongue and lips swelling, redness around mouth and chin   Penicillins Anaphylaxis, Swelling   Mouth swells Has patient had a PCN reaction causing immediate rash, facial/tongue/throat swelling, SOB or lightheadedness with hypotension: Yes Has patient had a PCN reaction causing severe rash involving mucus membranes or skin necrosis: No Has patient had a PCN reaction that required hospitalization Yes Has patient had a PCN reaction occurring within the last 10 years: No If all of the above answers are "NO", then may proceed with Cephalosporin use.   Carrot [daucus Carota] Other (See Comments)   Cooked carrots causes abdominal pain   Celery Oil Other (See Comments)   Cooked celery causes abdominal pain   Gluten Meal Other (See Comments)   Abdominal pain   Lactose Intolerance (gi) Other (See Comments)   Per allergy test pt does not eat any dairy   Latex Other (See Comments)   Dentist's gloves make inside of mouth raw        Medication List        Accurate as of March 25, 2023 11:19 AM. If you have any questions, ask your nurse or doctor.          Align 4 MG  Caps Take 4 mg by mouth every morning.   aspirin EC 81 MG tablet Take 81 mg by mouth daily.   b complex vitamins tablet Take 1 tablet by mouth daily.   BAYER BACK & BODY PO Take 1 tablet by mouth 2 (two) times daily as needed (pain).   bimatoprost 0.01 % Soln Commonly known as: LUMIGAN Place 1 drop into both eyes at bedtime.   cholecalciferol 10 MCG (400 UNIT) Tabs tablet Commonly known as: VITAMIN D3 Take 400 Units by mouth.   cycloSPORINE 0.05 % ophthalmic emulsion Commonly known as: RESTASIS Place 1 drop into both eyes every 12 (twelve) hours.   dorzolamide 2 % ophthalmic solution Commonly known as: TRUSOPT Place 1 drop into both eyes 3 (three) times daily.   ICaps Caps Take 1  capsule by mouth 2 (two) times daily.   Lutein 40 MG Caps Take 40 mg by mouth daily with lunch.   OMEGA-3 PLUS PO Take 1 capsule by mouth daily.   OVER THE COUNTER MEDICATION Take 1 tablet by mouth daily as needed (allergies). Flora Sinus   oxyCODONE-acetaminophen 5-325 MG tablet Commonly known as: Percocet Take 1 tablet by mouth every 4 (four) hours as needed.   oxyCODONE-acetaminophen 7.5-325 MG tablet Commonly known as: Percocet Take 1 tablet by mouth every 4 (four) hours as needed for severe pain.   REFRESH OPTIVE OP Place 1 drop into both eyes 3 (three) times daily as needed (dry eyes).   SYSTANE ULTRA OP Place 1 drop into both eyes 3 (three) times daily as needed (dry eyes).   vitamin B-12 500 MCG tablet Commonly known as: CYANOCOBALAMIN Take 500 mcg by mouth daily.   vitamin C 1000 MG tablet Take 1,000 mg by mouth 2 (two) times daily.   ZEGERID PO Take by mouth.        Allergies:  Allergies  Allergen Reactions   Erythromycin Swelling    Tongue and lips swelling, redness around mouth and chin   Penicillins Anaphylaxis and Swelling    Mouth swells Has patient had a PCN reaction causing immediate rash, facial/tongue/throat swelling, SOB or lightheadedness with hypotension: Yes Has patient had a PCN reaction causing severe rash involving mucus membranes or skin necrosis: No Has patient had a PCN reaction that required hospitalization Yes Has patient had a PCN reaction occurring within the last 10 years: No If all of the above answers are "NO", then may proceed with Cephalosporin use.   Carrot [Daucus Carota] Other (See Comments)    Cooked carrots causes abdominal pain   Celery Oil Other (See Comments)    Cooked celery causes abdominal pain   Gluten Meal Other (See Comments)    Abdominal pain   Lactose Intolerance (Gi) Other (See Comments)    Per allergy test pt does not eat any dairy   Latex Other (See Comments)    Dentist's gloves make inside of mouth  raw    Family History: Family History  Problem Relation Age of Onset   Colon cancer Neg Hx     Social History:  reports that she has never smoked. She has never used smokeless tobacco. She reports that she does not drink alcohol and does not use drugs.  ROS: All other review of systems were reviewed and are negative except what is noted above in HPI  Physical Exam: BP 115/71   Pulse 74   Constitutional:  Alert and oriented, No acute distress. HEENT: [place] AT, moist mucus membranes.  Trachea midline, no masses.  Cardiovascular: No clubbing, cyanosis, or edema. Respiratory: Normal respiratory effort, no increased work of breathing. GI: Abdomen is soft, nontender, nondistended, no abdominal masses GU: No CVA tenderness.  Lymph: No cervical or inguinal lymphadenopathy. Skin: No rashes, bruises or suspicious lesions. Neurologic: Grossly intact, no focal deficits, moving all 4 extremities. Psychiatric: Normal mood and affect.  Laboratory Data: Lab Results  Component Value Date   WBC 4.9 05/07/2015   HGB 13.4 05/07/2015   HCT 40.8 05/07/2015   MCV 91.7 05/07/2015   PLT 165 05/07/2015    No results found for: "CREATININE"  No results found for: "PSA"  No results found for: "TESTOSTERONE"  No results found for: "HGBA1C"  Urinalysis    Component Value Date/Time   COLORURINE RED (A) 08/17/2021 0025   APPEARANCEUR Clear 02/22/2022 1553   LABSPEC  08/17/2021 0025    TEST NOT REPORTED DUE TO COLOR INTERFERENCE OF URINE PIGMENT   PHURINE  08/17/2021 0025    TEST NOT REPORTED DUE TO COLOR INTERFERENCE OF URINE PIGMENT   GLUCOSEU Negative 02/22/2022 1553   HGBUR (A) 08/17/2021 0025    TEST NOT REPORTED DUE TO COLOR INTERFERENCE OF URINE PIGMENT   BILIRUBINUR Negative 02/22/2022 1553   KETONESUR (A) 08/17/2021 0025    TEST NOT REPORTED DUE TO COLOR INTERFERENCE OF URINE PIGMENT   PROTEINUR Negative 02/22/2022 1553   PROTEINUR (A) 08/17/2021 0025    TEST NOT REPORTED DUE TO  COLOR INTERFERENCE OF URINE PIGMENT   NITRITE Negative 02/22/2022 1553   NITRITE (A) 08/17/2021 0025    TEST NOT REPORTED DUE TO COLOR INTERFERENCE OF URINE PIGMENT   LEUKOCYTESUR Trace (A) 02/22/2022 1553   LEUKOCYTESUR (A) 08/17/2021 0025    TEST NOT REPORTED DUE TO COLOR INTERFERENCE OF URINE PIGMENT    Lab Results  Component Value Date   LABMICR See below: 02/22/2022   WBCUA 0-5 02/22/2022   LABEPIT 0-10 02/22/2022   BACTERIA None seen 02/22/2022    Pertinent Imaging:  No results found for this or any previous visit.  No results found for this or any previous visit.  No results found for this or any previous visit.  No results found for this or any previous visit.  No results found for this or any previous visit.  No valid procedures specified. No results found for this or any previous visit.  No results found for this or any previous visit.   Assessment & Plan:    1. Gross hematuria -resolved. Followup PRN   No follow-ups on file.  Wilkie Aye, MD  [facility] [place]

## 2023-03-25 NOTE — Patient Instructions (Signed)

## 2023-04-05 DIAGNOSIS — L609 Nail disorder, unspecified: Secondary | ICD-10-CM | POA: Diagnosis not present

## 2023-04-05 DIAGNOSIS — M79672 Pain in left foot: Secondary | ICD-10-CM | POA: Diagnosis not present

## 2023-04-05 DIAGNOSIS — M79674 Pain in right toe(s): Secondary | ICD-10-CM | POA: Diagnosis not present

## 2023-04-05 DIAGNOSIS — M79671 Pain in right foot: Secondary | ICD-10-CM | POA: Diagnosis not present

## 2023-04-05 DIAGNOSIS — M79675 Pain in left toe(s): Secondary | ICD-10-CM | POA: Diagnosis not present

## 2023-04-05 DIAGNOSIS — B351 Tinea unguium: Secondary | ICD-10-CM | POA: Diagnosis not present

## 2023-05-03 DIAGNOSIS — B351 Tinea unguium: Secondary | ICD-10-CM | POA: Diagnosis not present

## 2023-05-03 DIAGNOSIS — M79674 Pain in right toe(s): Secondary | ICD-10-CM | POA: Diagnosis not present

## 2023-05-03 DIAGNOSIS — M79671 Pain in right foot: Secondary | ICD-10-CM | POA: Diagnosis not present

## 2023-05-03 DIAGNOSIS — M79672 Pain in left foot: Secondary | ICD-10-CM | POA: Diagnosis not present

## 2023-05-03 DIAGNOSIS — M79675 Pain in left toe(s): Secondary | ICD-10-CM | POA: Diagnosis not present

## 2023-05-03 DIAGNOSIS — L609 Nail disorder, unspecified: Secondary | ICD-10-CM | POA: Diagnosis not present

## 2023-05-04 DIAGNOSIS — H2511 Age-related nuclear cataract, right eye: Secondary | ICD-10-CM | POA: Diagnosis not present

## 2023-05-04 DIAGNOSIS — H401133 Primary open-angle glaucoma, bilateral, severe stage: Secondary | ICD-10-CM | POA: Diagnosis not present

## 2023-05-04 DIAGNOSIS — H47233 Glaucomatous optic atrophy, bilateral: Secondary | ICD-10-CM | POA: Diagnosis not present

## 2023-05-10 DIAGNOSIS — H401133 Primary open-angle glaucoma, bilateral, severe stage: Secondary | ICD-10-CM | POA: Diagnosis not present

## 2023-07-14 DIAGNOSIS — L609 Nail disorder, unspecified: Secondary | ICD-10-CM | POA: Diagnosis not present

## 2023-07-14 DIAGNOSIS — M79672 Pain in left foot: Secondary | ICD-10-CM | POA: Diagnosis not present

## 2023-07-14 DIAGNOSIS — B351 Tinea unguium: Secondary | ICD-10-CM | POA: Diagnosis not present

## 2023-07-14 DIAGNOSIS — M79674 Pain in right toe(s): Secondary | ICD-10-CM | POA: Diagnosis not present

## 2023-07-14 DIAGNOSIS — M79675 Pain in left toe(s): Secondary | ICD-10-CM | POA: Diagnosis not present

## 2023-07-14 DIAGNOSIS — M79671 Pain in right foot: Secondary | ICD-10-CM | POA: Diagnosis not present

## 2023-08-23 DIAGNOSIS — H47233 Glaucomatous optic atrophy, bilateral: Secondary | ICD-10-CM | POA: Diagnosis not present

## 2023-08-23 DIAGNOSIS — H2511 Age-related nuclear cataract, right eye: Secondary | ICD-10-CM | POA: Diagnosis not present

## 2023-08-23 DIAGNOSIS — H401133 Primary open-angle glaucoma, bilateral, severe stage: Secondary | ICD-10-CM | POA: Diagnosis not present

## 2023-09-15 DIAGNOSIS — M79671 Pain in right foot: Secondary | ICD-10-CM | POA: Diagnosis not present

## 2023-09-15 DIAGNOSIS — L609 Nail disorder, unspecified: Secondary | ICD-10-CM | POA: Diagnosis not present

## 2023-09-15 DIAGNOSIS — B351 Tinea unguium: Secondary | ICD-10-CM | POA: Diagnosis not present

## 2023-09-15 DIAGNOSIS — M79672 Pain in left foot: Secondary | ICD-10-CM | POA: Diagnosis not present

## 2023-09-15 DIAGNOSIS — M79674 Pain in right toe(s): Secondary | ICD-10-CM | POA: Diagnosis not present

## 2023-09-15 DIAGNOSIS — M79675 Pain in left toe(s): Secondary | ICD-10-CM | POA: Diagnosis not present

## 2023-12-02 DIAGNOSIS — H47233 Glaucomatous optic atrophy, bilateral: Secondary | ICD-10-CM | POA: Diagnosis not present

## 2023-12-02 DIAGNOSIS — H401133 Primary open-angle glaucoma, bilateral, severe stage: Secondary | ICD-10-CM | POA: Diagnosis not present

## 2023-12-19 DIAGNOSIS — H2511 Age-related nuclear cataract, right eye: Secondary | ICD-10-CM | POA: Diagnosis not present

## 2023-12-19 DIAGNOSIS — H47233 Glaucomatous optic atrophy, bilateral: Secondary | ICD-10-CM | POA: Diagnosis not present

## 2023-12-19 DIAGNOSIS — H401133 Primary open-angle glaucoma, bilateral, severe stage: Secondary | ICD-10-CM | POA: Diagnosis not present

## 2023-12-26 DIAGNOSIS — M79672 Pain in left foot: Secondary | ICD-10-CM | POA: Diagnosis not present

## 2023-12-26 DIAGNOSIS — M79671 Pain in right foot: Secondary | ICD-10-CM | POA: Diagnosis not present

## 2023-12-26 DIAGNOSIS — L609 Nail disorder, unspecified: Secondary | ICD-10-CM | POA: Diagnosis not present

## 2023-12-26 DIAGNOSIS — M79674 Pain in right toe(s): Secondary | ICD-10-CM | POA: Diagnosis not present

## 2023-12-26 DIAGNOSIS — I739 Peripheral vascular disease, unspecified: Secondary | ICD-10-CM | POA: Diagnosis not present

## 2023-12-26 DIAGNOSIS — M79675 Pain in left toe(s): Secondary | ICD-10-CM | POA: Diagnosis not present

## 2024-03-05 DIAGNOSIS — Z0100 Encounter for examination of eyes and vision without abnormal findings: Secondary | ICD-10-CM | POA: Diagnosis not present

## 2024-03-05 DIAGNOSIS — H3540 Unspecified peripheral retinal degeneration: Secondary | ICD-10-CM | POA: Diagnosis not present

## 2024-03-05 DIAGNOSIS — H5213 Myopia, bilateral: Secondary | ICD-10-CM | POA: Diagnosis not present

## 2024-03-06 DIAGNOSIS — L609 Nail disorder, unspecified: Secondary | ICD-10-CM | POA: Diagnosis not present

## 2024-03-06 DIAGNOSIS — M79671 Pain in right foot: Secondary | ICD-10-CM | POA: Diagnosis not present

## 2024-03-06 DIAGNOSIS — M79674 Pain in right toe(s): Secondary | ICD-10-CM | POA: Diagnosis not present

## 2024-03-06 DIAGNOSIS — B351 Tinea unguium: Secondary | ICD-10-CM | POA: Diagnosis not present

## 2024-03-06 DIAGNOSIS — M79675 Pain in left toe(s): Secondary | ICD-10-CM | POA: Diagnosis not present

## 2024-03-06 DIAGNOSIS — M79672 Pain in left foot: Secondary | ICD-10-CM | POA: Diagnosis not present

## 2024-04-03 DIAGNOSIS — H2511 Age-related nuclear cataract, right eye: Secondary | ICD-10-CM | POA: Diagnosis not present

## 2024-04-03 DIAGNOSIS — H401133 Primary open-angle glaucoma, bilateral, severe stage: Secondary | ICD-10-CM | POA: Diagnosis not present

## 2024-04-03 DIAGNOSIS — H47233 Glaucomatous optic atrophy, bilateral: Secondary | ICD-10-CM | POA: Diagnosis not present

## 2024-05-24 ENCOUNTER — Other Ambulatory Visit: Payer: Self-pay

## 2024-05-24 ENCOUNTER — Emergency Department (HOSPITAL_COMMUNITY)

## 2024-05-24 ENCOUNTER — Emergency Department (HOSPITAL_COMMUNITY)
Admission: EM | Admit: 2024-05-24 | Discharge: 2024-05-24 | Disposition: A | Discharge status: Home / Self Care | Attending: Emergency Medicine | Admitting: Emergency Medicine

## 2024-05-24 DIAGNOSIS — Z91040 Latex allergy status: Secondary | ICD-10-CM | POA: Diagnosis not present

## 2024-05-24 DIAGNOSIS — Z7982 Long term (current) use of aspirin: Secondary | ICD-10-CM | POA: Insufficient documentation

## 2024-05-24 DIAGNOSIS — I10 Essential (primary) hypertension: Secondary | ICD-10-CM | POA: Insufficient documentation

## 2024-05-24 DIAGNOSIS — R42 Dizziness and giddiness: Secondary | ICD-10-CM

## 2024-05-24 LAB — CBC WITH DIFFERENTIAL/PLATELET
Abs Immature Granulocytes: 0.02 K/uL (ref 0.00–0.07)
Basophils Absolute: 0.1 K/uL (ref 0.0–0.1)
Basophils Relative: 1 %
Eosinophils Absolute: 0 K/uL (ref 0.0–0.5)
Eosinophils Relative: 0 %
HCT: 40.4 % (ref 36.0–46.0)
Hemoglobin: 13.5 g/dL (ref 12.0–15.0)
Immature Granulocytes: 0 %
Lymphocytes Relative: 19 %
Lymphs Abs: 1.4 K/uL (ref 0.7–4.0)
MCH: 30.1 pg (ref 26.0–34.0)
MCHC: 33.4 g/dL (ref 30.0–36.0)
MCV: 90 fL (ref 80.0–100.0)
Monocytes Absolute: 0.5 K/uL (ref 0.1–1.0)
Monocytes Relative: 6 %
Neutro Abs: 5.6 K/uL (ref 1.7–7.7)
Neutrophils Relative %: 74 %
Platelets: 177 K/uL (ref 150–400)
RBC: 4.49 MIL/uL (ref 3.87–5.11)
RDW: 13 % (ref 11.5–15.5)
WBC: 7.6 K/uL (ref 4.0–10.5)
nRBC: 0 % (ref 0.0–0.2)

## 2024-05-24 LAB — URINALYSIS, ROUTINE W REFLEX MICROSCOPIC
Bilirubin Urine: NEGATIVE
Glucose, UA: NEGATIVE mg/dL
Hgb urine dipstick: NEGATIVE
Ketones, ur: NEGATIVE mg/dL
Leukocytes,Ua: NEGATIVE
Nitrite: NEGATIVE
Protein, ur: NEGATIVE mg/dL
Specific Gravity, Urine: 1.01 (ref 1.005–1.030)
pH: 7 (ref 5.0–8.0)

## 2024-05-24 LAB — COMPREHENSIVE METABOLIC PANEL WITH GFR
ALT: 25 U/L (ref 0–44)
AST: 24 U/L (ref 15–41)
Albumin: 4.1 g/dL (ref 3.5–5.0)
Alkaline Phosphatase: 71 U/L (ref 38–126)
Anion gap: 10 (ref 5–15)
BUN: 9 mg/dL (ref 8–23)
CO2: 27 mmol/L (ref 22–32)
Calcium: 9.1 mg/dL (ref 8.9–10.3)
Chloride: 99 mmol/L (ref 98–111)
Creatinine, Ser: 0.53 mg/dL (ref 0.44–1.00)
GFR, Estimated: >60 mL/min (ref >60)
Glucose, Bld: 113 mg/dL — ABNORMAL HIGH (ref 70–99)
Potassium: 3.7 mmol/L (ref 3.5–5.1)
Sodium: 136 mmol/L (ref 135–145)
Total Bilirubin: 0.4 mg/dL (ref 0.0–1.2)
Total Protein: 6.8 g/dL (ref 6.5–8.1)

## 2024-05-24 LAB — CBG MONITORING, ED: Glucose-Capillary: 111 mg/dL — ABNORMAL HIGH (ref 70–99)

## 2024-05-24 MED ORDER — MECLIZINE HCL 12.5 MG PO TABS
25.0000 mg | ORAL_TABLET | Freq: Once | ORAL | Status: AC
  Administered 2024-05-24: 25 mg via ORAL
  Filled 2024-05-24: qty 2

## 2024-05-24 MED ORDER — IOHEXOL 350 MG/ML SOLN
75.0000 mL | Freq: Once | INTRAVENOUS | Status: AC | PRN
  Administered 2024-05-24: 75 mL via INTRAVENOUS

## 2024-05-24 NOTE — ED Notes (Signed)
 Megan PA, notified of vital signs

## 2024-05-24 NOTE — ED Provider Notes (Signed)
 [place] EMERGENCY DEPARTMENT AT [facility] Provider Note   CSN: [id number] Arrival date & time: 05/24/24  1254     Patient presents with: Dizziness   Mikayla [name] is a 83 y.o. female who presents to the ED with hypertension that began at the chiropractors office. The symptoms, she states, started when she laid down on the table and had a short episode of dizziness upon standing. Patient states when they took her blood pressure at the office they noticed it was elevated.  Patient states that she does not have a history of high blood pressure.  Denies LOC. The patient states that she is currently asymptomatic, however is still having persistent hypertension.     Dizziness      Prior to Admission medications   Medication Sig Start Date End Date Taking? Authorizing Provider  Ascorbic Acid (VITAMIN C) 1000 MG tablet Take 1,000 mg by mouth 2 (two) times daily.     [provider]  aspirin EC 81 MG tablet Take 81 mg by mouth daily.    [provider]  Aspirin-Caffeine (BAYER BACK & BODY PO) Take 1 tablet by mouth 2 (two) times daily as needed (pain).    [provider]  b complex vitamins tablet Take 1 tablet by mouth daily.    [provider]  bimatoprost (LUMIGAN) 0.01 % SOLN Place 1 drop into both eyes at bedtime.    [provider]  Carboxymethylcellul-Glycerin (REFRESH OPTIVE OP) Place 1 drop into both eyes 3 (three) times daily as needed (dry eyes).     [provider]  cholecalciferol (VITAMIN D) 400 units TABS tablet Take 400 Units by mouth.    [provider]  cycloSPORINE (RESTASIS) 0.05 % ophthalmic emulsion Place 1 drop into both eyes every 12 (twelve) hours.     [provider]  dorzolamide (TRUSOPT) 2 % ophthalmic solution Place 1 drop into both eyes 3 (three) times daily. 04/15/15   [provider]  Lutein 40 MG CAPS Take 40 mg by mouth daily with lunch.    [provider]   Multiple Vitamins-Minerals (ICAPS) CAPS Take 1 capsule by mouth 2 (two) times daily.     [provider]  Omega-3 Fatty Acids (OMEGA-3 PLUS PO) Take 1 capsule by mouth daily.    [provider]  Omeprazole-Sodium Bicarbonate (ZEGERID PO) Take by mouth.    [provider]  OVER THE COUNTER MEDICATION Take 1 tablet by mouth daily as needed (allergies). Flora Sinus    [provider]  oxyCODONE -acetaminophen  (PERCOCET) 5-325 MG tablet Take 1 tablet by mouth every 4 (four) hours as needed. 12/29/15   Bluford Rogue, MD  oxyCODONE -acetaminophen  (PERCOCET) 7.5-325 MG tablet Take 1 tablet by mouth every 4 (four) hours as needed for severe pain. 01/01/16   Kuzma, Gary, MD  Polyethyl Glycol-Propyl Glycol (SYSTANE ULTRA OP) Place 1 drop into both eyes 3 (three) times daily as needed (dry eyes).    [provider]  Probiotic Product (ALIGN) 4 MG CAPS Take 4 mg by mouth every morning.     [provider]  vitamin B-12 (CYANOCOBALAMIN) 500 MCG tablet Take 500 mcg by mouth daily.    [provider]    Allergies: Erythromycin, Penicillins, Carrot [daucus carota], Celery oil, Gluten meal, Lactose intolerance (gi), and Latex    Review of Systems  Neurological:  Positive for dizziness.    Updated Vital Signs BP (!) 168/94   Pulse 89   Temp (!)  97.5 F (36.4 C) (Oral)   Resp 16   Wt 79.4 kg   SpO2 97%   BMI 30.05 kg/m   Physical Exam Vitals and nursing note reviewed.  Constitutional:      General: She is not in acute distress.    Appearance: Normal appearance.  HENT:     Head: Normocephalic and atraumatic.  Eyes:     General: No visual field deficit.    Extraocular Movements: Extraocular movements intact.     Conjunctiva/sclera: Conjunctivae normal.     Pupils: Pupils are equal, round, and reactive to light.  Cardiovascular:     Rate and Rhythm: Normal rate and regular rhythm.     Pulses: Normal pulses.  Pulmonary:     Effort:  Pulmonary effort is normal. No respiratory distress.  Abdominal:     General: Abdomen is flat.     Palpations: Abdomen is soft.     Tenderness: There is no abdominal tenderness.  Musculoskeletal:        General: Normal range of motion.     Cervical back: Normal range of motion.  Skin:    General: Skin is warm and dry.     Capillary Refill: Capillary refill takes less than 2 seconds.  Neurological:     General: No focal deficit present.     Mental Status: She is alert. Mental status is at baseline.     Cranial Nerves: Cranial nerves 2-12 are intact. No cranial nerve deficit or dysarthria.     Sensory: Sensation is intact. No sensory deficit.     Motor: Motor function is intact. No weakness or tremor.     Coordination: Coordination is intact. Finger-Nose-Finger Test normal.     Gait: Gait is intact. Gait normal.     Comments: Neuro exam grossly unremarkable.  All cranial nerves intact.  No sensory deficits, numbness, or weakness.  Psychiatric:        Mood and Affect: Mood normal.     (all labs ordered are listed, but only abnormal results are displayed) Labs Reviewed  COMPREHENSIVE METABOLIC PANEL WITH GFR - Abnormal; Notable for the following components:      Result Value   Glucose, Bld 113 (*)    All other components within normal limits  CBG MONITORING, ED - Abnormal; Notable for the following components:   Glucose-Capillary 111 (*)    All other components within normal limits  CBC WITH DIFFERENTIAL/PLATELET  URINALYSIS, ROUTINE W REFLEX MICROSCOPIC    EKG: EKG Interpretation Date/Time:  Thursday May 24 2024 14:54:44 EST Ventricular Rate:  67 PR Interval:  139 QRS Duration:  90 QT Interval:  388 QTC Calculation: 410 R Axis:   67  Text Interpretation: Sinus rhythm Confirmed by Francesca Fallow ([id number]) on 05/24/2024 4:11:20 PM  Radiology: No results found.   Procedures   Medications Ordered in the ED  meclizine  (ANTIVERT ) tablet 25 mg (25 mg Oral Given  05/24/24 1718)  iohexol  (OMNIPAQUE ) 350 MG/ML injection 75 mL (75 mLs Intravenous Contrast Given 05/24/24 1748)                                    Medical Decision Making Amount and/or Complexity of Data Reviewed Labs: ordered. Radiology: ordered.  Risk Prescription drug management.   Patient presents to the ED for concern of hypertension, this involves an extensive number of treatment options, and is a complaint that carries with it a high risk  of complications and morbidity.    The differential diagnosis includes: Essential hypertension CVA/TIA Other neurologic etiology  Additional history obtained: Additional history obtained from Family, Outside Medical Records, and Past Admission  External records from outside source obtained and reviewed including medical history, surgical history, medications, allergies. The patient was a reliable historian, providing a clear, detailed, and consistent account of the presenting symptoms and relevant medical history. The information was obtained directly from the patient and statements were documented in the patient's own words when possible. No discrepancies were noted between the history provided and available collateral sources.     Lab Tests: I ordered, and personally interpreted labs.   The pertinent results include:  POC CBG - no acute findings CBC - no acute findings  CMP - no acute findings  Urinalysis - no acute findings   Imaging Studies ordered: I ordered imaging studies including: CT ANGIO head/neck  MR BRAIN wo contrast I independently visualized and interpreted imaging which showed: No acute intracranial abnormality. No large vessel occlusion or dissection. 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  I agree with the radiologist interpretation  Cardiac Monitoring: The patient was maintained on a cardiac monitor.  I  personally viewed and interpreted the cardiac monitored which showed an underlying rhythm of: Normal sinus rhythm  Medicines ordered and prescription drug management: I ordered medications: Meclizine  25 mg for dizziness Reevaluation of the patient after these medicines showed that the patient stayed the same.  Patient stated that she had minimal relief. I have reviewed the patients home medicines and have made adjustments as needed  Test Considered: No additional diagnostic testing was considered based on the patient's presenting symptoms, risk factors, and initial clinical assessment.  The approach to diagnostic testing prioritized exclusion of life-threatening conditions  Problem List / ED Course: Problem List: Hypertension Emergency Department Course: The patient presented with hypertension after an episode of dizziness at the chiropractor. Initial assessment included history, physical exam, and review of prior medical records.  CMP and CBC evaluated for possible infectious or metabolic etiology - no acute findings.  Given complaint of dizziness and patient stating that she has not had much to eat today, POC glucose completed -no acute findings.  Urinalysis completed and found to be negative.  Given patient complaint of new onset hypertension and episode of dizziness while getting an adjustment at the chiropractor CT angio head neck was performed and found no acute intracranial abnormality however some chronic changes were visualized.  Given chronic changes MRI brain without contrast was completed and found some mild chronic small vessel ischemic disease located to the pons.  Given complaint of dizziness, orthostatics were completed and found to be negative.  Patient remained hypertensive for the entirety of visit, all other vitals remained stable. Patient was given meclizine  for dizziness and she stated that it provided minimal relief. Patient had no neurologic changes during visit.    Reevaluation: After the interventions noted above, I reevaluated the patient and found that they have :stayed the same  Dispostion: After consideration of patient's medical history, history of present illness, laboratory studies, imaging, I feel that the patent would benefit from discharge home with supportive care measures and close follow-up with neurology for further evaluation and care. Clinical Assessment:    Working diagnosis: Essential hypertension, dizziness Disposition Plan: The patient is medically stable for discharge from the Emergency Department at this time. Vital signs are within  normal limits, and the patient is alert, oriented, and in no acute distress. Evaluation has been completed with no findings necessitating hospital admission or further emergent workup.  Communication:   Patient and husband informed of disposition decision and rationale. Questions addressed.  The results and clinical impression were discussed with the patient at bedside and the patient demonstrated understanding.     Final diagnoses:  Hypertension, unspecified type  Dizziness    ED Discharge Orders     None          Willma Duwaine CROME, [place] 05/28/24 1623    Francesca Elsie CROME, MD 06/02/24 1755

## 2024-05-24 NOTE — ED Triage Notes (Signed)
 Pt went to chiropractor today and when getting off the table started to feel dizzy. Pt states she did get up fast and then when they check her BP is was 190/88

## 2024-05-24 NOTE — Discharge Instructions (Addendum)
 Thank you for visiting the Emergency Department today.  It was a pleasure to be part of your healthcare team. You were seen for hypertension and an episode of dizziness.  At home, rest, hydrate, and resume normal diet. It is important to watch for warning signs such as worsening dizziness, numbness, tingling, pain, chest pain, shortness of breath. If any of these happen, return to the Emergency Department or call 911. Thank you for trusting us  with your health.

## 2024-07-23 ENCOUNTER — Ambulatory Visit

## 2024-07-23 VITALS — BP 152/88 | HR 80 | Temp 97.9°F | Ht 64.0 in | Wt 183.0 lb

## 2024-07-23 DIAGNOSIS — Z13220 Encounter for screening for lipoid disorders: Secondary | ICD-10-CM

## 2024-07-23 DIAGNOSIS — K219 Gastro-esophageal reflux disease without esophagitis: Secondary | ICD-10-CM

## 2024-07-23 DIAGNOSIS — R030 Elevated blood-pressure reading, without diagnosis of hypertension: Secondary | ICD-10-CM

## 2024-07-23 DIAGNOSIS — R5383 Other fatigue: Secondary | ICD-10-CM

## 2024-07-23 DIAGNOSIS — Z0000 Encounter for general adult medical examination without abnormal findings: Secondary | ICD-10-CM

## 2024-07-23 DIAGNOSIS — R739 Hyperglycemia, unspecified: Secondary | ICD-10-CM

## 2024-07-23 DIAGNOSIS — Z0001 Encounter for general adult medical examination with abnormal findings: Secondary | ICD-10-CM

## 2024-07-23 NOTE — Progress Notes (Signed)
 "  New Patient Office Visit  Subjective    Patient ID: Mikayla [name], female    DOB: June 20, 1941  Age: 84 y.o. MRN: [id number]  HPI Mikayla [name] presents to establish care and for annual physical.   The patient comes in today for a wellness visit.  A review of their health history was completed. A review of medications was also completed.  Any needed refills; No  Eating habits: Good, follows a gluten free diet due to IBS and GERD symptoms  Falls/  MVA accidents in past few months: No  Regular exercise: Yes walks her driveway daily up to a mile. Does exercises at home.   Sleep: Good  Menstrual cycles/sexual history: Post-menopausal, not currently sexually active.   Specialist pt sees on regular basis: Urology, ophthalmology.   Regular eye/dental exams: Yes  Preventative health issues were discussed.   Additional concerns: Did recently have a dizzy episode where she did go to the emergency department. Followed up with ENT, and she is back at baseline.   Past Medical History:  Diagnosis Date   Arthritis    MILD   Chronic back pain    GOES TO CHIROPRACTOR EVERY OTHER MONTH   Complication of anesthesia    with hysterectomy in 1982   GERD (gastroesophageal reflux disease)    TAKES ZEGERID DAILY    Glaucoma    Hiatal hernia    small   History of bronchitis 1996   History of migraine    NOTHING IN 54YRS    IBS (irritable bowel syndrome)    Internal hemorrhoids    Pneumonia 1986   PONV (postoperative nausea and vomiting)     Past Surgical History:  Procedure Laterality Date   ABDOMINAL HYSTERECTOMY  1981   APPENDECTOMY     CATARACT EXTRACTION W/PHACO Left 05/07/2015   Procedure: CATARACT EXTRACTION PHACO AND INTRAOCULAR LENS PLACEMENT (IOC) WITH MINI TUBE, PLACEMENT OF MITOSOL ;  Surgeon: Gaither Quan, MD;  Location: [facility] OR;  Service: Ophthalmology;  Laterality: Left;   COLONOSCOPY  03/06/2004   Dr. Shaaron- internal hemorrhoids, o/w normal colon.   COLONOSCOPY N/A  03/04/2014   Procedure: COLONOSCOPY;  Surgeon: Lamar CHRISTELLA Shaaron, MD;  Location: [facility];  Service: Endoscopy;  Laterality: N/A;  730   ELBOW/WRIST SURGERY Right AT AGE 71   ESOPHAGOGASTRODUODENOSCOPY  01/08/2005   Dr. Shaaron- multiple distal esophageal erosions consistent with erosive reflux esophagitis, small hiatal hernia o/w normal stomach   LEFT OOPHORECTOMY     partial   OPEN REDUCTION INTERNAL FIXATION (ORIF) DISTAL RADIAL FRACTURE Right 01/01/2016   Procedure: OPEN REDUCTION INTERNAL FIXATION (ORIF) RIGHT DISTAL RADIUS  ;  Surgeon: Arley Curia, MD;  Location: [facility];  Service: Orthopedics;  Laterality: Right;   RIGHT OOPHORECTOMY     TONSILLECTOMY     AS A CHILD    Family History  Problem Relation Age of Onset   Colon cancer Neg Hx     Social History   Socioeconomic History   Marital status: Widowed    Spouse name: Not on file   Number of children: Not on file   Years of education: Not on file   Highest education level: Not on file  Occupational History   Not on file  Tobacco Use   Smoking status: Never   Smokeless tobacco: Never  Vaping Use   Vaping status: Never Used  Substance and Sexual Activity   Alcohol use: No   Drug use: No   Sexual activity:  Not Currently    Birth control/protection: Surgical  Other Topics Concern   Not on file  Social History Narrative   Not on file   Social Drivers of Health   Tobacco Use: Low Risk (03/25/2023)   Patient History    Smoking Tobacco Use: Never    Smokeless Tobacco Use: Never    Passive Exposure: Not on file  Financial Resource Strain: Not on file  Food Insecurity: Not on file  Transportation Needs: Not on file  Physical Activity: Not on file  Stress: Not on file  Social Connections: Not on file  Intimate Partner Violence: Not on file  Depression (EYV7-0): Not on file  Alcohol Screen: Not on file  Housing: Not on file  Utilities: Not on file  Health Literacy: Not on file    Review of  Systems  Constitutional:  Negative for appetite change, fatigue and unexpected weight change.  HENT:  Negative for trouble swallowing.   Eyes:  Negative for visual disturbance.  Respiratory:  Negative for cough, chest tightness, shortness of breath and wheezing.   Cardiovascular:  Negative for chest pain, palpitations and leg swelling.  Gastrointestinal:  Negative for constipation, diarrhea, nausea and vomiting.  Genitourinary:  Negative for difficulty urinating, vaginal bleeding, vaginal discharge and vaginal pain.  Neurological:  Negative for dizziness, light-headedness and headaches.  Psychiatric/Behavioral:  Negative for sleep disturbance. The patient is not nervous/anxious.    Objective    Today's Vitals   07/23/24 1522 07/23/24 1557  BP: (!) 143/81 (!) 152/88  Pulse: 80   Temp: 97.9 F (36.6 C)   TempSrc: Temporal   SpO2: 93%   Weight: 183 lb (83 kg)   Height: 5' 4 (1.626 m)    Body mass index is 31.41 kg/m.   Physical Exam Vitals and nursing note reviewed.  Constitutional:      General: She is not in acute distress.    Appearance: Normal appearance. She is not ill-appearing.  Cardiovascular:     Rate and Rhythm: Normal rate and regular rhythm.     Heart sounds: Normal heart sounds, S1 normal and S2 normal. No murmur heard. Pulmonary:     Effort: Pulmonary effort is normal. No respiratory distress.     Breath sounds: Normal breath sounds. No wheezing.  Abdominal:     General: There is no distension.     Palpations: Abdomen is soft.     Tenderness: There is no abdominal tenderness.     Hernia: No hernia is present.  Musculoskeletal:     Right lower leg: No edema.     Left lower leg: No edema.  Lymphadenopathy:     Cervical: No cervical adenopathy.  Skin:    General: Skin is warm and dry.  Neurological:     Mental Status: She is alert. Mental status is at baseline.  Psychiatric:        Mood and Affect: Mood normal.        Behavior: Behavior normal.         Thought Content: Thought content normal.        Judgment: Judgment normal.    The ASCVD Risk score (Arnett DK, et al., 2019) failed to calculate for the following reasons:   The 2019 ASCVD risk score is only valid for ages 33 to 45   * - Cholesterol units were assumed   Assessment & Plan:  1. Well woman exam without gynecological exam (Primary) Adult wellness-complete.wellness physical was conducted today. Importance of diet and exercise were  discussed in detail.  Importance of stress reduction and healthy living were discussed.  In addition to this a discussion regarding safety was also covered.  We also reviewed over immunizations and gave recommendations regarding current immunization needed for age.   In addition to this additional areas were also touched on including: Preventative health exams needed: None  -Patient has had updated bone density.  Recommended patient to get bone density results sent to our office.  Patient agrees.  Patient was advised yearly wellness exam.  2. Screening for lipid disorders - Lipid panel  3. Elevated random blood glucose level - CMP14+EGFR - Hemoglobin A1c  4. Fatigue, unspecified type - CBC with Differential  5. Gastroesophageal reflux disease without esophagitis -Patient's GERD is stable.  Taking over-the-counter omeprazole daily.  6. Elevated blood pressure reading -Patient's blood pressures are in the 110's systolic at home. Recommended patient to monitor blood pressures at home.  Gave target blood pressure of less than 150 systolic.  To follow-up if blood pressures greater than this.  Return in about 1 year (around 07/23/2025) for physical.   Damien KATHEE Pringle, FNP  "

## 2024-09-24 ENCOUNTER — Ambulatory Visit: Admitting: Neurology

## 2024-09-25 ENCOUNTER — Ambulatory Visit: Admitting: Neurology
